# Patient Record
Sex: Female | Born: 1960
Health system: Southern US, Community
[De-identification: ages and names within clinical notes are randomized; demographics above are authoritative.]

## PROBLEM LIST (undated history)

## (undated) DIAGNOSIS — K317 Polyp of stomach and duodenum: Secondary | ICD-10-CM

## (undated) DIAGNOSIS — H33312 Horseshoe tear of retina without detachment, left eye: Secondary | ICD-10-CM

## (undated) DIAGNOSIS — E538 Deficiency of other specified B group vitamins: Secondary | ICD-10-CM

## (undated) DIAGNOSIS — T4145XA Adverse effect of unspecified anesthetic, initial encounter: Secondary | ICD-10-CM

## (undated) DIAGNOSIS — D126 Benign neoplasm of colon, unspecified: Secondary | ICD-10-CM

## (undated) DIAGNOSIS — T7840XA Allergy, unspecified, initial encounter: Secondary | ICD-10-CM

## (undated) DIAGNOSIS — K31A0 Gastric intestinal metaplasia, unspecified: Secondary | ICD-10-CM

## (undated) DIAGNOSIS — T8859XA Other complications of anesthesia, initial encounter: Secondary | ICD-10-CM

## (undated) DIAGNOSIS — K2940 Chronic atrophic gastritis without bleeding: Secondary | ICD-10-CM

## (undated) DIAGNOSIS — N63 Unspecified lump in unspecified breast: Secondary | ICD-10-CM

## (undated) DIAGNOSIS — D509 Iron deficiency anemia, unspecified: Secondary | ICD-10-CM

## (undated) DIAGNOSIS — H269 Unspecified cataract: Secondary | ICD-10-CM

## (undated) DIAGNOSIS — K219 Gastro-esophageal reflux disease without esophagitis: Secondary | ICD-10-CM

## (undated) DIAGNOSIS — E785 Hyperlipidemia, unspecified: Secondary | ICD-10-CM

## (undated) DIAGNOSIS — F419 Anxiety disorder, unspecified: Secondary | ICD-10-CM

## (undated) HISTORY — PX: BREAST CYST ASPIRATION: SHX578

## (undated) HISTORY — PX: BREAST EXCISIONAL BIOPSY: SUR124

## (undated) HISTORY — PX: COLONOSCOPY: SHX174

## (undated) HISTORY — PX: RETINAL TEAR REPAIR CRYOTHERAPY: SHX5304

## (undated) HISTORY — DX: Iron deficiency anemia, unspecified: D50.9

## (undated) HISTORY — DX: Benign neoplasm of colon, unspecified: D12.6

## (undated) HISTORY — PX: UPPER GASTROINTESTINAL ENDOSCOPY: SHX188

## (undated) HISTORY — DX: Hyperlipidemia, unspecified: E78.5

## (undated) HISTORY — PX: ABDOMINAL HYSTERECTOMY: SHX81

## (undated) HISTORY — DX: Allergy, unspecified, initial encounter: T78.40XA

## (undated) HISTORY — PX: OTHER SURGICAL HISTORY: SHX169

## (undated) HISTORY — PX: CYSTOCELE REPAIR: SHX163

## (undated) HISTORY — DX: Deficiency of other specified B group vitamins: E53.8

## (undated) HISTORY — DX: Unspecified cataract: H26.9

## (undated) HISTORY — PX: RECTOCELE REPAIR: SHX761

## (undated) HISTORY — DX: Gastro-esophageal reflux disease without esophagitis: K21.9

## (undated) HISTORY — PX: POLYPECTOMY: SHX149

## (undated) HISTORY — PX: TOE SURGERY: SHX1073

## (undated) HISTORY — DX: Gastric intestinal metaplasia, unspecified: K31.A0

## (undated) HISTORY — PX: BREAST BIOPSY: SHX20

## (undated) HISTORY — PX: WISDOM TOOTH EXTRACTION: SHX21

## (undated) HISTORY — DX: Polyp of stomach and duodenum: K31.7

## (undated) HISTORY — PX: TONSILLECTOMY: SUR1361

## (undated) HISTORY — DX: Chronic atrophic gastritis without bleeding: K29.40

---

## 1998-07-31 ENCOUNTER — Ambulatory Visit (HOSPITAL_BASED_OUTPATIENT_CLINIC_OR_DEPARTMENT_OTHER): Admission: RE | Admit: 1998-07-31 | Discharge: 1998-07-31 | Payer: Self-pay | Admitting: Orthopedic Surgery

## 1999-05-08 ENCOUNTER — Other Ambulatory Visit: Admission: RE | Admit: 1999-05-08 | Discharge: 1999-05-08 | Payer: Self-pay | Admitting: Obstetrics and Gynecology

## 2000-05-11 ENCOUNTER — Other Ambulatory Visit: Admission: RE | Admit: 2000-05-11 | Discharge: 2000-05-11 | Payer: Self-pay | Admitting: Obstetrics and Gynecology

## 2000-07-14 ENCOUNTER — Encounter (INDEPENDENT_AMBULATORY_CARE_PROVIDER_SITE_OTHER): Payer: Self-pay

## 2000-07-14 ENCOUNTER — Other Ambulatory Visit: Admission: RE | Admit: 2000-07-14 | Discharge: 2000-07-14 | Payer: Self-pay | Admitting: Obstetrics and Gynecology

## 2000-12-09 ENCOUNTER — Other Ambulatory Visit: Admission: RE | Admit: 2000-12-09 | Discharge: 2000-12-09 | Payer: Self-pay | Admitting: Obstetrics and Gynecology

## 2001-04-21 ENCOUNTER — Encounter: Admission: RE | Admit: 2001-04-21 | Discharge: 2001-04-21 | Payer: Self-pay | Admitting: Family Medicine

## 2001-04-21 ENCOUNTER — Encounter: Payer: Self-pay | Admitting: Family Medicine

## 2001-05-19 ENCOUNTER — Other Ambulatory Visit: Admission: RE | Admit: 2001-05-19 | Discharge: 2001-05-19 | Payer: Self-pay | Admitting: Obstetrics and Gynecology

## 2002-06-06 ENCOUNTER — Other Ambulatory Visit: Admission: RE | Admit: 2002-06-06 | Discharge: 2002-06-06 | Payer: Self-pay | Admitting: Obstetrics and Gynecology

## 2003-06-12 ENCOUNTER — Other Ambulatory Visit: Admission: RE | Admit: 2003-06-12 | Discharge: 2003-06-12 | Payer: Self-pay | Admitting: Obstetrics & Gynecology

## 2004-11-18 ENCOUNTER — Ambulatory Visit: Payer: Self-pay | Admitting: Psychology

## 2004-11-24 ENCOUNTER — Ambulatory Visit: Payer: Self-pay | Admitting: Psychology

## 2004-12-17 ENCOUNTER — Ambulatory Visit: Payer: Self-pay | Admitting: Psychology

## 2004-12-23 ENCOUNTER — Ambulatory Visit: Payer: Self-pay | Admitting: Psychology

## 2004-12-31 ENCOUNTER — Ambulatory Visit: Payer: Self-pay | Admitting: Psychology

## 2005-01-09 ENCOUNTER — Ambulatory Visit: Payer: Self-pay | Admitting: Psychology

## 2005-01-22 ENCOUNTER — Ambulatory Visit: Payer: Self-pay | Admitting: Psychology

## 2005-01-27 ENCOUNTER — Ambulatory Visit: Payer: Self-pay | Admitting: Psychology

## 2005-02-18 ENCOUNTER — Ambulatory Visit: Payer: Self-pay | Admitting: Psychology

## 2005-03-04 ENCOUNTER — Ambulatory Visit: Payer: Self-pay | Admitting: Psychology

## 2005-03-20 ENCOUNTER — Ambulatory Visit: Payer: Self-pay | Admitting: Psychology

## 2005-04-03 ENCOUNTER — Ambulatory Visit: Payer: Self-pay | Admitting: Psychology

## 2005-04-28 ENCOUNTER — Ambulatory Visit: Payer: Self-pay | Admitting: Psychology

## 2005-05-26 ENCOUNTER — Ambulatory Visit: Payer: Self-pay | Admitting: Psychology

## 2005-06-12 ENCOUNTER — Ambulatory Visit: Payer: Self-pay | Admitting: Psychology

## 2005-07-24 ENCOUNTER — Ambulatory Visit: Payer: Self-pay | Admitting: Psychology

## 2005-08-07 ENCOUNTER — Ambulatory Visit: Payer: Self-pay | Admitting: Psychology

## 2005-09-14 ENCOUNTER — Ambulatory Visit: Payer: Self-pay | Admitting: Psychology

## 2005-11-23 ENCOUNTER — Encounter: Admission: RE | Admit: 2005-11-23 | Discharge: 2005-11-23 | Payer: Self-pay | Admitting: Family Medicine

## 2006-01-06 ENCOUNTER — Ambulatory Visit: Payer: Self-pay | Admitting: Psychology

## 2006-10-13 ENCOUNTER — Encounter: Admission: RE | Admit: 2006-10-13 | Discharge: 2006-10-13 | Payer: Self-pay | Admitting: Obstetrics and Gynecology

## 2007-05-11 DIAGNOSIS — D126 Benign neoplasm of colon, unspecified: Secondary | ICD-10-CM

## 2007-05-11 HISTORY — DX: Benign neoplasm of colon, unspecified: D12.6

## 2007-07-29 ENCOUNTER — Encounter: Admission: RE | Admit: 2007-07-29 | Discharge: 2007-07-29 | Payer: Self-pay | Admitting: Obstetrics and Gynecology

## 2007-10-19 ENCOUNTER — Encounter: Admission: RE | Admit: 2007-10-19 | Discharge: 2007-10-19 | Payer: Self-pay | Admitting: Obstetrics and Gynecology

## 2008-04-27 ENCOUNTER — Encounter: Admission: RE | Admit: 2008-04-27 | Discharge: 2008-04-27 | Payer: Self-pay | Admitting: Obstetrics and Gynecology

## 2008-09-12 ENCOUNTER — Encounter (INDEPENDENT_AMBULATORY_CARE_PROVIDER_SITE_OTHER): Payer: Self-pay | Admitting: Orthopedic Surgery

## 2008-09-12 ENCOUNTER — Ambulatory Visit (HOSPITAL_BASED_OUTPATIENT_CLINIC_OR_DEPARTMENT_OTHER): Admission: RE | Admit: 2008-09-12 | Discharge: 2008-09-12 | Payer: Self-pay | Admitting: Orthopedic Surgery

## 2009-03-05 ENCOUNTER — Encounter: Admission: RE | Admit: 2009-03-05 | Discharge: 2009-03-05 | Payer: Self-pay | Admitting: Obstetrics and Gynecology

## 2009-10-24 ENCOUNTER — Encounter: Admission: RE | Admit: 2009-10-24 | Discharge: 2009-10-24 | Payer: Self-pay | Admitting: Obstetrics and Gynecology

## 2010-09-26 ENCOUNTER — Other Ambulatory Visit: Payer: Self-pay | Admitting: Obstetrics

## 2010-09-26 DIAGNOSIS — Z1231 Encounter for screening mammogram for malignant neoplasm of breast: Secondary | ICD-10-CM

## 2010-10-07 ENCOUNTER — Ambulatory Visit
Admission: RE | Admit: 2010-10-07 | Discharge: 2010-10-07 | Disposition: A | Discharge status: Home / Self Care | Payer: 59 | Source: Ambulatory Visit | Attending: Obstetrics | Admitting: Obstetrics

## 2010-10-07 DIAGNOSIS — Z1231 Encounter for screening mammogram for malignant neoplasm of breast: Secondary | ICD-10-CM

## 2010-10-09 ENCOUNTER — Other Ambulatory Visit: Payer: Self-pay | Admitting: Obstetrics

## 2010-10-09 DIAGNOSIS — R928 Other abnormal and inconclusive findings on diagnostic imaging of breast: Secondary | ICD-10-CM

## 2010-10-15 ENCOUNTER — Ambulatory Visit
Admission: RE | Admit: 2010-10-15 | Discharge: 2010-10-15 | Disposition: A | Discharge status: Home / Self Care | Payer: 59 | Source: Ambulatory Visit | Attending: Obstetrics | Admitting: Obstetrics

## 2010-10-15 DIAGNOSIS — R928 Other abnormal and inconclusive findings on diagnostic imaging of breast: Secondary | ICD-10-CM

## 2010-10-22 ENCOUNTER — Other Ambulatory Visit: Payer: Self-pay | Admitting: Obstetrics

## 2010-11-25 LAB — POCT HEMOGLOBIN-HEMACUE: Hemoglobin: 13.6 g/dL (ref 12.0–15.0)

## 2010-12-23 NOTE — Op Note (Signed)
NAMESHAQUANDA, Joan Cobb               ACCOUNT NO.:  [id number]   MEDICAL RECORD NO.:  [id number]          PATIENT TYPE:  AMB   LOCATION:  DSC                          FACILITY:  [facility]   PHYSICIAN:  Leonides Grills, M.D.     DATE OF BIRTH:  03-11-1961   DATE OF PROCEDURE:  09/12/2008  DATE OF DISCHARGE:                               OPERATIVE REPORT   PREOPERATIVE DIAGNOSIS:  Benign deep soft tissue lesion, left great toe.   POSTOPERATIVE DIAGNOSIS:  Benign deep soft tissue lesion, left great  toe.   OPERATION:  Excision, benign deep soft tissue lesion, left great toe.   ANESTHESIA:  General.   SURGEON:  Leonides Grills, MD   ASSISTANT:  Richardean Canal, PA-C   ESTIMATED BLOOD LOSS:  Minimal.   TOURNIQUET TIME:  Approximately 20 minutes.   COMPLICATIONS:  None.   DISPOSITION:  Stable to PR.   PATHOLOGY SPECIMEN:  Synovial cyst that was ruptured with a portion of  the EHL tendon sheath.   COMPLICATIONS:  None.   INDICATIONS:  This is a 50 year old female who has had a painful lesion  on the dorsal aspect of her left great toe over her EHL tendon.  She was  sent consented for the above procedure.  All risks of infection or  vessel injury were recurrence and approximately 50%, persistent pain,  scarring were all explained, and questions were encouraged and answered.   OPERATION:  The patient was brought to the operating room and placed in  supine position.  After adequate general anesthesia administered as well  as Ancef 1 g IV piggyback.  Left lower extremity was then prepped and  draped in sterile manner approximated with a thigh tourniquet.  Wound  was gravity exsanguinated.  Tourniquet was elevated to 290 mmHg.  A  longitudinal incision over the lesion was then made.  Dissection was  carried down through skin.  Hemostasis was obtained.  The lesion was  carefully dissected out, and the medial and lateral aspects of the EHL  tendon.  Once this was carefully dissected out, there  was no synovial  fluid in this lesion.  It was well circumscribed and once this was  removed with a portion of the tendon sheath of the EHL tendon, the area  was inspected and there was no extension into the MTP joint itself.  The  joint was ranged and there was no synovial fluid or synovial cystic type  fluid that could be expressed from this area and there was no sinus to  this area as well.  It appeared to be pristine.  The tendon itself  looked pristine as well.  There was no degeneration on the tendon  underneath the lesion and excursion of the tendon did not show anything  as well.  The area was copiously irrigated with normal saline.  Tourniquet was inflated.  Hemostasis was obtained.  Lesion was sent to  pathology.  The wound was copiously irrigated with normal saline.  The  wound was closed with 4-0 nylon stitch.  Sterile dressing was applied.  Hard sole shoe was applied.  The patient was stable to the PR.      Leonides Grills, M.D.  Electronically Signed     PB/MEDQ  D:  09/12/2008  T:  09/12/2008  Job:  [id number]

## 2011-08-10 LAB — POC HEMOCCULT BLD/STL (HOME/3-CARD/SCREEN)

## 2011-08-24 ENCOUNTER — Encounter: Payer: Self-pay | Admitting: Gastroenterology

## 2011-08-24 ENCOUNTER — Ambulatory Visit (INDEPENDENT_AMBULATORY_CARE_PROVIDER_SITE_OTHER): Payer: 59 | Admitting: Gastroenterology

## 2011-08-24 VITALS — BP 108/68 | HR 98 | Ht 65.5 in | Wt 149.2 lb

## 2011-08-24 DIAGNOSIS — Z86010 Personal history of colonic polyps: Secondary | ICD-10-CM

## 2011-08-24 DIAGNOSIS — D509 Iron deficiency anemia, unspecified: Secondary | ICD-10-CM | POA: Insufficient documentation

## 2011-08-24 DIAGNOSIS — R1319 Other dysphagia: Secondary | ICD-10-CM

## 2011-08-24 DIAGNOSIS — K5900 Constipation, unspecified: Secondary | ICD-10-CM

## 2011-08-24 MED ORDER — ALIGN 4 MG PO CAPS: 1.0000 | ORAL_CAPSULE | Freq: Every day | ORAL | Status: DC

## 2011-08-24 MED ORDER — PEG-KCL-NACL-NASULF-NA ASC-C 100 G PO SOLR: 1.0000 | Freq: Once | ORAL | Status: DC

## 2011-08-24 MED ORDER — OMEPRAZOLE 20 MG PO CPDR: 20.0000 mg | DELAYED_RELEASE_CAPSULE | Freq: Two times a day (BID) | ORAL | Status: DC

## 2011-08-24 NOTE — Patient Instructions (Addendum)
You have been scheduled for a Upper Endoscopy/Colonoscopy with propofol. See separate instructions. Pick up your prep kit from your pharmacy.  Start Align samples one tablet by mouth once daily.  Increase your omeprazole to twice daily. A new prescription has been sent to your pharmacy.  Patient advised to avoid spicy, acidic, citrus, chocolate, mints, fruit and fruit juices.  Limit the intake of caffeine, alcohol and Soda.  Don't exercise too soon after eating.  Don't lie down within 3-4 hours of eating.  Elevate the head of your bed. cc: Mosetta Putt, MD

## 2011-08-24 NOTE — Progress Notes (Addendum)
History of Present Illness: This is a 51 year old female who relates a 2 to 3 month history of postprandial abdominal bloating, early satiety with regurgitation and reflux symptoms. She also notes occasional difficulty swallowing solid foods. All these symptoms have improved, but not resolved, on bland diet and daily omeprazole. She notes constipation for the past 2-3 months which responded well to MiraLax and now she is taking a stool softener and fiber supplement with good results. She was found to have an iron deficiency anemia and Hemoccult negative stool in Dr. Geoffery Lyons office. She states she had a colonoscopy performed about 5 years ago with [facility] and was recommended to have a 5 year follow up. She does not recall polyps being diagnosed. Denies weight loss, diarrhea, change in stool caliber, melena, hematochezia, chest pain.  Allergies  Allergen Reactions  . Percocet (Oxycodone-Acetaminophen)    No outpatient prescriptions prior to visit.   Past Medical History  Diagnosis Date  . Iron deficiency anemia   . Allergic rhinitis   . Colon polyp 05/2007    adenomatous polyp   Past Surgical History  Procedure Date  . Breast biopsy     left  . Toe surgery     left  . Tonsillectomy   . Wisdom tooth extraction    History   Social History  . Marital Status: Married    Spouse Name: N/A    Number of Children: 2  . Years of Education: N/A   Occupational History  .     Social History Main Topics  . Smoking status: Never Smoker   . Smokeless tobacco: Never Used  . Alcohol Use: Yes  . Drug Use: No  . Sexually Active: None   Other Topics Concern  . None   Social History Narrative  . None   No family history on file.     Review of Systems: Pertinent positive and negative review of systems were noted in the above HPI section. All other review of systems were otherwise negative.  Physical Exam: General: Well developed , well nourished, no acute distress Head:  Normocephalic and atraumatic Eyes:  sclerae anicteric, EOMI Ears: Normal auditory acuity Mouth: No deformity or lesions Neck: Supple, no masses or thyromegaly Lungs: Clear throughout to auscultation Heart: Regular rate and rhythm; no murmurs, rubs or bruits Abdomen: Soft, non tender and non distended. No masses, hepatosplenomegaly or hernias noted. Normal Bowel sounds Rectal: Deferred to colonoscopy Musculoskeletal: Symmetrical with no gross deformities  Skin: No lesions on visible extremities Pulses:  Normal pulses noted Extremities: No clubbing, cyanosis, edema or deformities noted Neurological: Alert oriented x 4, grossly nonfocal Cervical Nodes:  No significant cervical adenopathy Inguinal Nodes: No significant inguinal adenopathy Psychological:  Alert and cooperative. Normal mood and affect  Assessment and Recommendations:  1. Iron deficiency anemia with Hemoccult negative stool. Rule out occult gastrointestinal losses from AVMs, neoplasms, ulcers, etc. Rule out celiac disease. Schedule colonoscopy and upper endoscopy with propofol as she stated she felt agitated after her prior colonoscopy. The risks, benefits, and alternatives to colonoscopy with possible biopsy and possible polypectomy were discussed with the patient and they consent to proceed. The risks, benefits, and alternatives to endoscopy with possible biopsy and possible dilation were discussed with the patient and they consent to proceed.   2. Dysphagia, GERD, regurgitation, early satiety and bloating. Rule out GERD, esophagitis, esophageal strictures. Upper endoscopy as above. Standard antireflux measures. Increase omeprazole to 20 mg twice a day. Trial of a low gas diet and  a daily probiotic.  3. New onset constipation. Continue high fiber diet with increased water intake and daily stool softener. Colonoscopy as above.  4. Adenomatous colon polyp, 05/2007. Colonoscopy as above.

## 2011-09-08 ENCOUNTER — Encounter: Payer: Self-pay | Admitting: Gastroenterology

## 2011-09-11 ENCOUNTER — Encounter: Payer: Self-pay | Admitting: Gastroenterology

## 2011-09-11 ENCOUNTER — Ambulatory Visit (AMBULATORY_SURGERY_CENTER): Payer: 59 | Admitting: Gastroenterology

## 2011-09-11 DIAGNOSIS — K5900 Constipation, unspecified: Secondary | ICD-10-CM

## 2011-09-11 DIAGNOSIS — R1319 Other dysphagia: Secondary | ICD-10-CM

## 2011-09-11 DIAGNOSIS — D126 Benign neoplasm of colon, unspecified: Secondary | ICD-10-CM

## 2011-09-11 DIAGNOSIS — K319 Disease of stomach and duodenum, unspecified: Secondary | ICD-10-CM

## 2011-09-11 DIAGNOSIS — D509 Iron deficiency anemia, unspecified: Secondary | ICD-10-CM

## 2011-09-11 DIAGNOSIS — Z86010 Personal history of colonic polyps: Secondary | ICD-10-CM

## 2011-09-11 MED ORDER — SODIUM CHLORIDE 0.9 % IV SOLN: 500.0000 mL | INTRAVENOUS | Status: DC

## 2011-09-11 NOTE — Op Note (Addendum)
[facility] [address] [place], [place]  [address]  ENDOSCOPY PROCEDURE REPORT PATIENT:  Joan, Cobb  MR#:  [id number] BIRTHDATE:  06/03/1961, 50 yrs. old  GENDER:  female ENDOSCOPIST:  Judie Petit T. Russella Dar, MD, [facility] Referred by:  Mosetta Putt, M.D. PROCEDURE DATE:  09/11/2011 PROCEDURE:  EGD with biopsy and with Savary dilation over a guidewire ASA CLASS:  Class II INDICATIONS:  GERD, dysphagia, iron deficiency anemia MEDICATIONS:  MAC sedation administered by CRNA,  residual sedation effect present from prior procedure, propofol (Diprivan) 350mg  IV TOPICAL ANESTHETIC:  none DESCRIPTION OF PROCEDURE:   After the risks benefits and alternatives of the procedure were thoroughly explained, informed consent was obtained.  The [facility] GIF-H180 [id number] endoscope was introduced through the mouth and advanced to the second portion of the duodenum, without limitations.  The instrument was slowly withdrawn as the mucosa was fully examined. <<PROCEDUREIMAGES>> There were 5-6 polyps identified in the body of the stomach. Measuring 3-4 mm. Multiple biopsies were obtained and sent to pathology.  Mild gastritis was found in the body and fundus of the stomach. It was erythematous and granular. Multiple biopsies were obtained and sent to pathology.  Otherwise normal stomach.  The esophagus and gastroesophageal junction were completely normal in appearance. Savary / guidewire with a 17mm with no heme and no resistance. The duodenal bulb was normal in appearance, as was the postbulbar duodenum. Random biopsies were obtained and sent to pathology.  Retroflexed views revealed a hiatal hernia, small. The scope was then withdrawn from the patient and the procedure completed.  COMPLICATIONS:  None  ENDOSCOPIC IMPRESSION: 1) Gastric polyps 2) Mild gastritis 3) Small hiatal hernia  RECOMMENDATIONS: 1) Anti-reflux regimen 2) Await pathology results 3) Continue PPI: omeprazole 20 mg po  bid 4) OP follow-up in 4 weeks 5) Post dilation instructions  Joan Alejo T. Russella Dar, MD, [name]  n. eSIGNED:   Venita Lick. Nadine Ryle at 09/11/2011 12:02 PM  Clabe Seal, [phone]

## 2011-09-11 NOTE — Patient Instructions (Signed)
FOLLOW DISCHARGE INSTRUCTIONS (BLUE & GREEN SHEETS).  FOLLOW DILATATION DIET GIVEN TO YOU TODAY  INFORMATION ON GASTRITIS GIVEN TO YOU  CONTINUE REFLUX MEDICATION TWICE DAILY ( OMEPRAZOLE)

## 2011-09-11 NOTE — Progress Notes (Signed)
Patient did not experience a hospital transfer or hospital admission upon discharge from [facility]. [phone]) Patient did not experience any of the following events: a burn prior to discharge; a fall within the facility; wrong site/side/patient/procedure/implant event; or a hospital transfer or hospital admission upon discharge from the facility. [phone])

## 2011-09-11 NOTE — Op Note (Signed)
[facility] [address] [place], [place]  [address]  COLONOSCOPY PROCEDURE REPORT  PATIENT:  Joan, Cobb  MR#:  [id number] BIRTHDATE:  [birth year], 50 yrs. old  GENDER:  female ENDOSCOPIST:  Judie Petit T. Russella Dar, MD, [facility] Referred by:  Mosetta Putt, M.D. PROCEDURE DATE:  09/11/2011 PROCEDURE:  Colonoscopy with biopsy ASA CLASS:  Class II INDICATIONS:  1) Iron deficiency anemia  2) history of pre-cancerous (adenomatous) colon polyps: 05/2007. MEDICATIONS:   MAC sedation, administered by CRNA, propofol (Diprivan) 350 mg IV DESCRIPTION OF PROCEDURE:   After the risks benefits and alternatives of the procedure were thoroughly explained, informed consent was obtained.  Digital rectal exam was performed and revealed no abnormalities.   The LB 180AL [id number] endoscope was introduced through the anus and advanced to the cecum, which was identified by both the appendix and ileocecal valve, without limitations.  The quality of the prep was excellent, using MoviPrep.  The instrument was then slowly withdrawn as the colon was fully examined. <<PROCEDUREIMAGES>> FINDINGS:  A sessile polyp was found in the sigmoid colon. It was 4 mm in size. The polyp was removed using cold biopsy forceps. Otherwise normal colonoscopy without other polyps, masses, vascular ectasias, or inflammatory changes. Retroflexed views in the rectum revealed no abnormalities.  The time to cecum =  2.25 minutes. The scope was then withdrawn (time =  10.5  min) from the patient and the procedure completed.  COMPLICATIONS:  None  ENDOSCOPIC IMPRESSION: 1) 4 mm sessile polyp in the sigmoid colon  RECOMMENDATIONS: 1) Await pathology results 2) Repeat Colonoscopy in 5 years.  Venita Lick. Russella Dar, MD, [name]  n. eSIGNED:   Venita Lick. Stark at 09/11/2011 11:41 AM  Clabe Seal, [phone]

## 2011-09-14 ENCOUNTER — Telehealth: Payer: Self-pay | Admitting: *Deleted

## 2011-09-14 NOTE — Telephone Encounter (Signed)
No answer. Left message to call if questions or concerns. 

## 2011-09-24 ENCOUNTER — Encounter: Payer: Self-pay | Admitting: Gastroenterology

## 2011-10-14 ENCOUNTER — Ambulatory Visit: Payer: 59 | Admitting: Gastroenterology

## 2011-10-26 ENCOUNTER — Encounter: Payer: Self-pay | Admitting: Gastroenterology

## 2011-10-26 ENCOUNTER — Ambulatory Visit (INDEPENDENT_AMBULATORY_CARE_PROVIDER_SITE_OTHER): Payer: 59 | Admitting: Gastroenterology

## 2011-10-26 ENCOUNTER — Other Ambulatory Visit: Payer: 59

## 2011-10-26 VITALS — BP 120/68 | HR 72 | Ht 65.0 in | Wt 148.0 lb

## 2011-10-26 DIAGNOSIS — K219 Gastro-esophageal reflux disease without esophagitis: Secondary | ICD-10-CM

## 2011-10-26 DIAGNOSIS — K2940 Chronic atrophic gastritis without bleeding: Secondary | ICD-10-CM

## 2011-10-26 NOTE — Progress Notes (Signed)
History of Present Illness: This is a 51 year old female returning for followup of iron deficiency anemia, GERD and atrophic gastritis. Her reflux symptoms are well controlled on antireflux measures and omeprazole. She has alternating frequent bowel movements and mild constipation. Symptoms have improved somewhat on Colace and high-fiber diet. Her gastric biopsy report is below:  CHRONIC ACTIVE ATROPHIC GASTRITIS WITH ASSOCIATED FOVEOLAR HYPERPLASIA, EXTENSIVE INTESTINAL METAPLASIA AND NEUROENDOCRINE CELL PROLIFERATION. - NO EVIDENCE OF HELICOBACTER PYLORI, DYSPLASIA OR MALIGNANCY.  Current Medications, Allergies, Past Medical History, Past Surgical History, Family History and Social History were reviewed in [employer] record.  Physical Exam: General: Well developed , well nourished, no acute distress Psychological:  Alert and cooperative. Normal mood and affect no additional physical exam performed today  Assessment and Recommendations:  1. Atrophic gastritis with extensive intestinal metaplasia and neuroendocrine cell proliferation. The features on her biopsy suggests that her atrophic gastritis is trending toward its a precancerous process and it should be monitored. I previously reviewed the biopsy results with another pathologist and reviewed potential management options with 2 of my GI colleagues. I spent 25 minutes with her discussing her condition and the recommended followup. Recommend repeat upper endoscopy with gastric mapping in one year to include multiple, separate biopsies of the antrum, body and fundus. Obtain an anti-parietal antibody and anti-intrinsic factor antibody today. Consider future endoscopies every 1 to 2 years based on findings at endoscopy.  2. GERD. Symptoms well controlled on antireflux measures and omeprazole twice a day. Reduce omeprazole to once daily and if her symptoms remain controlled she may taper off omeprazole and retreat as  needed.  3. Personal history of adenomatous colon polyps. Surveillance colonoscopy February 2018.  4. Alternating bowel habits. Continue high fiber diet with adequate daily water intake. He tried several different products and taper to discontinue Colace she may use Colace as needed.  5. Iron deficiency anemia. Possibly secondary to reduced gastric acid which is associated with atrophic gastritis however there was no other gastrointestinal explanation for iron deficiency uncovered. Followup with Dr. Cora Daniels.

## 2011-10-26 NOTE — Patient Instructions (Addendum)
Your physician has requested that you go to the basement for the following lab work before leaving today:Parietal Cell Antibody, Intrinsic Factor Antibodies. Decrease taking your omeprazole to once daily and continue to decrease if symptoms under control to every other day and then every 3rd day. After wards stop if symptoms have not returned.  You will be due for a recall Endoscopy in 09/10/2012. We will send you a reminder in the mail when it gets closer to that time.  cc: Mosetta Putt, MD

## 2011-10-27 LAB — INTRINSIC FACTOR ANTIBODIES: Intrinsic Factor: POSITIVE — AB

## 2011-10-28 LAB — ANTI-PARIETAL ANTIBODY: Parietal Cell Antibody-IgG: POSITIVE — AB

## 2011-10-29 ENCOUNTER — Other Ambulatory Visit: Payer: Self-pay

## 2011-10-29 DIAGNOSIS — D510 Vitamin B12 deficiency anemia due to intrinsic factor deficiency: Secondary | ICD-10-CM

## 2011-10-30 ENCOUNTER — Other Ambulatory Visit (INDEPENDENT_AMBULATORY_CARE_PROVIDER_SITE_OTHER): Payer: 59

## 2011-10-30 DIAGNOSIS — D510 Vitamin B12 deficiency anemia due to intrinsic factor deficiency: Secondary | ICD-10-CM

## 2011-10-30 LAB — CBC WITH DIFFERENTIAL/PLATELET
Basophils Absolute: 0.1 10*3/uL (ref 0.0–0.1)
Basophils Relative: 0.9 % (ref 0.0–3.0)
Eosinophils Relative: 2.3 % (ref 0.0–5.0)
HCT: 38.8 % (ref 36.0–46.0)
Hemoglobin: 12.9 g/dL (ref 12.0–15.0)
Lymphocytes Relative: 23.4 % (ref 12.0–46.0)
Monocytes Relative: 5.4 % (ref 3.0–12.0)
Neutro Abs: 4.5 10*3/uL (ref 1.4–7.7)
RBC: 4.46 Mil/uL (ref 3.87–5.11)
WBC: 6.6 10*3/uL (ref 4.5–10.5)

## 2011-11-02 LAB — VITAMIN B12: Vitamin B-12: 138 pg/mL — ABNORMAL LOW (ref 211–911)

## 2011-11-13 ENCOUNTER — Other Ambulatory Visit: Payer: Self-pay | Admitting: Obstetrics

## 2011-11-13 DIAGNOSIS — Z1231 Encounter for screening mammogram for malignant neoplasm of breast: Secondary | ICD-10-CM

## 2011-11-23 IMAGING — MG MM DIGITAL DIAGNOSTIC BILAT LTD {BCG}
4 series · 4 of 4 positions shown · non-contrast
Comparison: 03/05/2009, 07/29/2007

CLINICAL DATA: The patient returns for evaluation of  fluctuating
masses in each breast noted on screening study dated 10/07/2010.

DIGITAL DIAGNOSTIC BILATERAL LIMITED MAMMOGRAM  AND BILATERAL
BREAST ULTRASOUND:

[L CC]
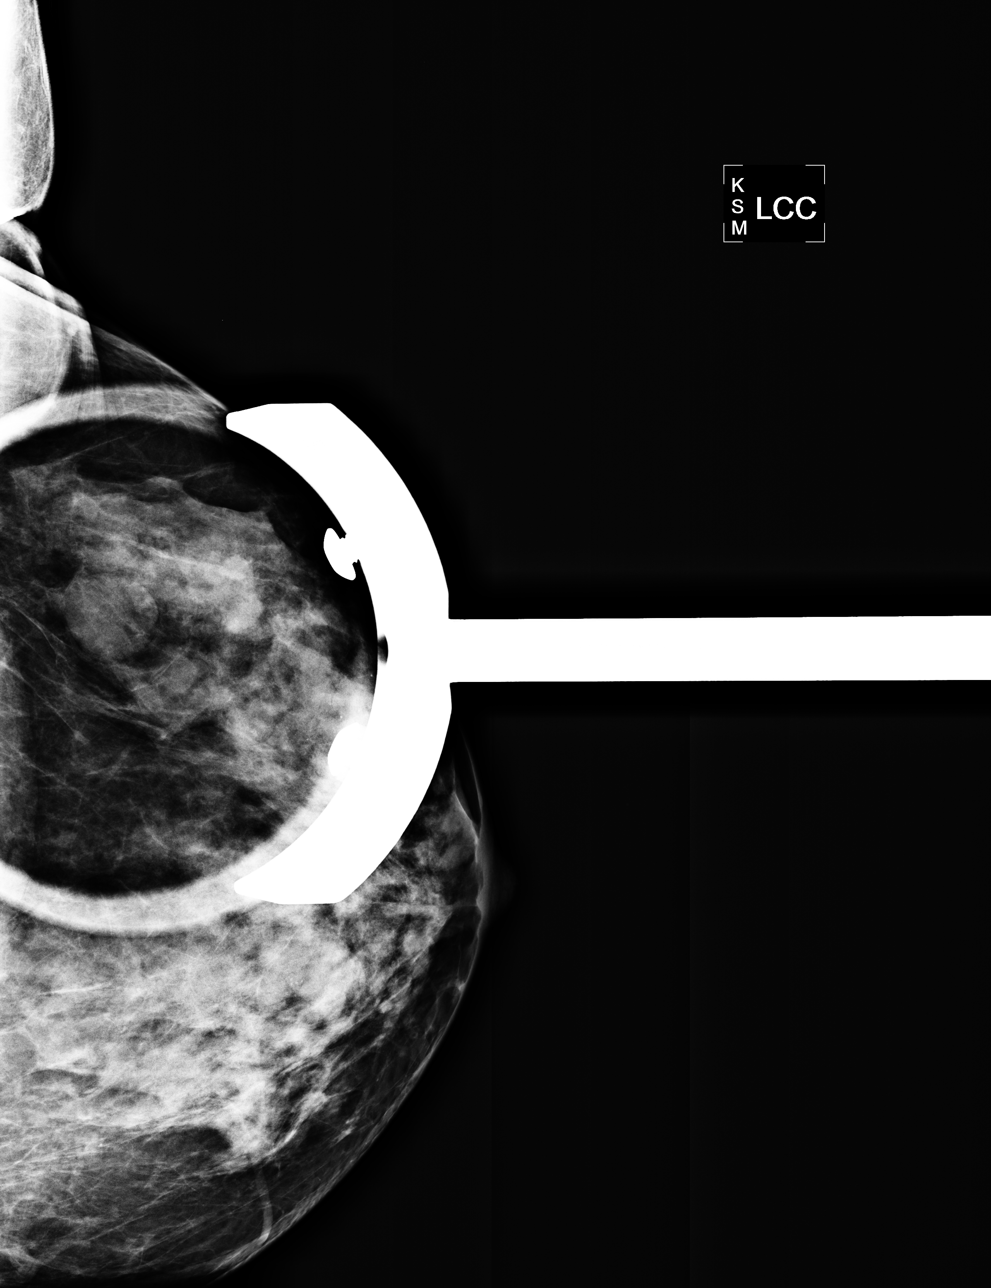

[L MLO]
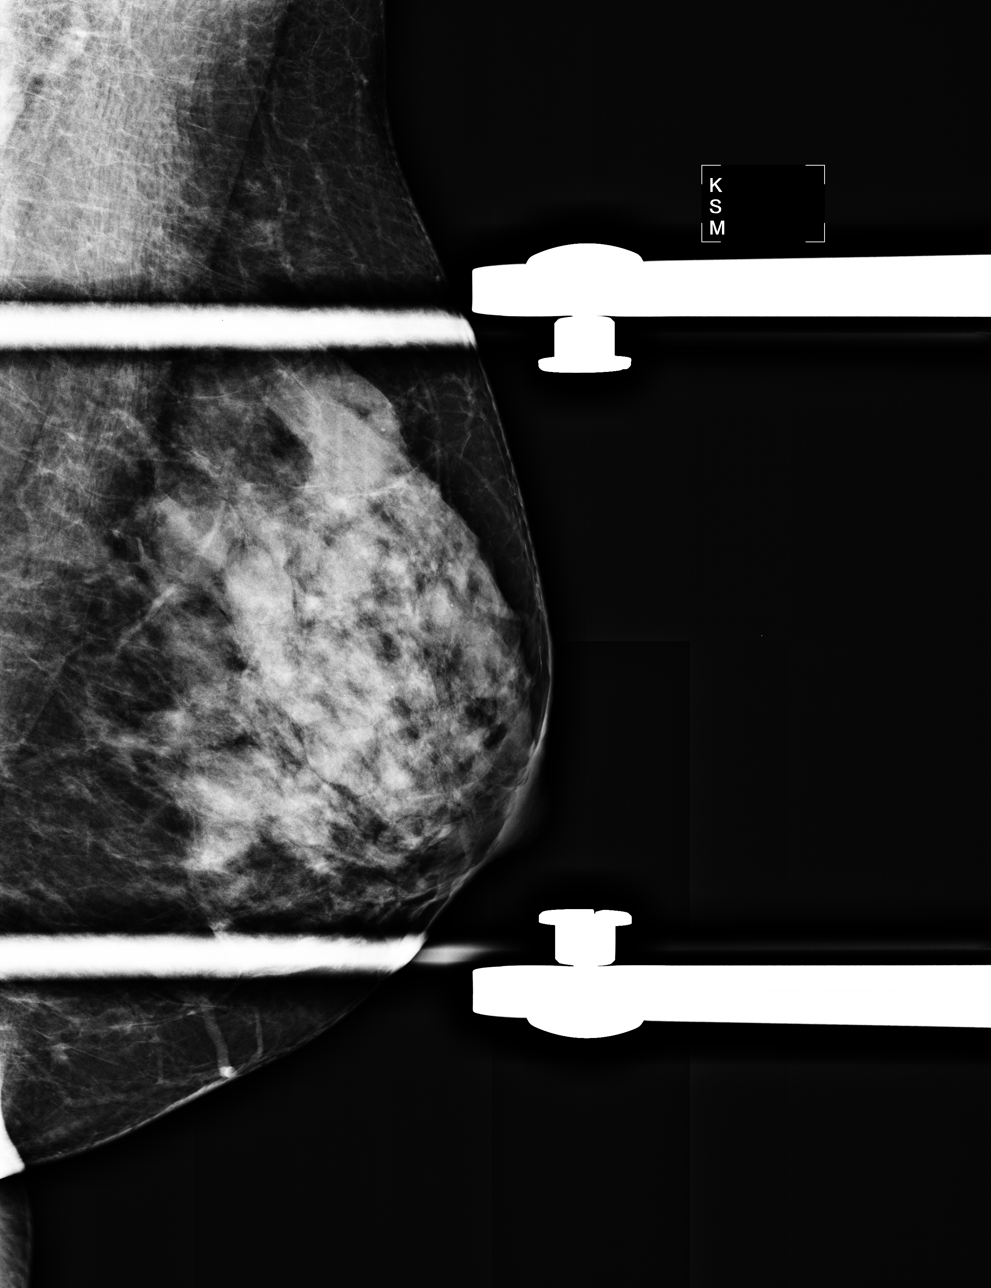

[R CC]
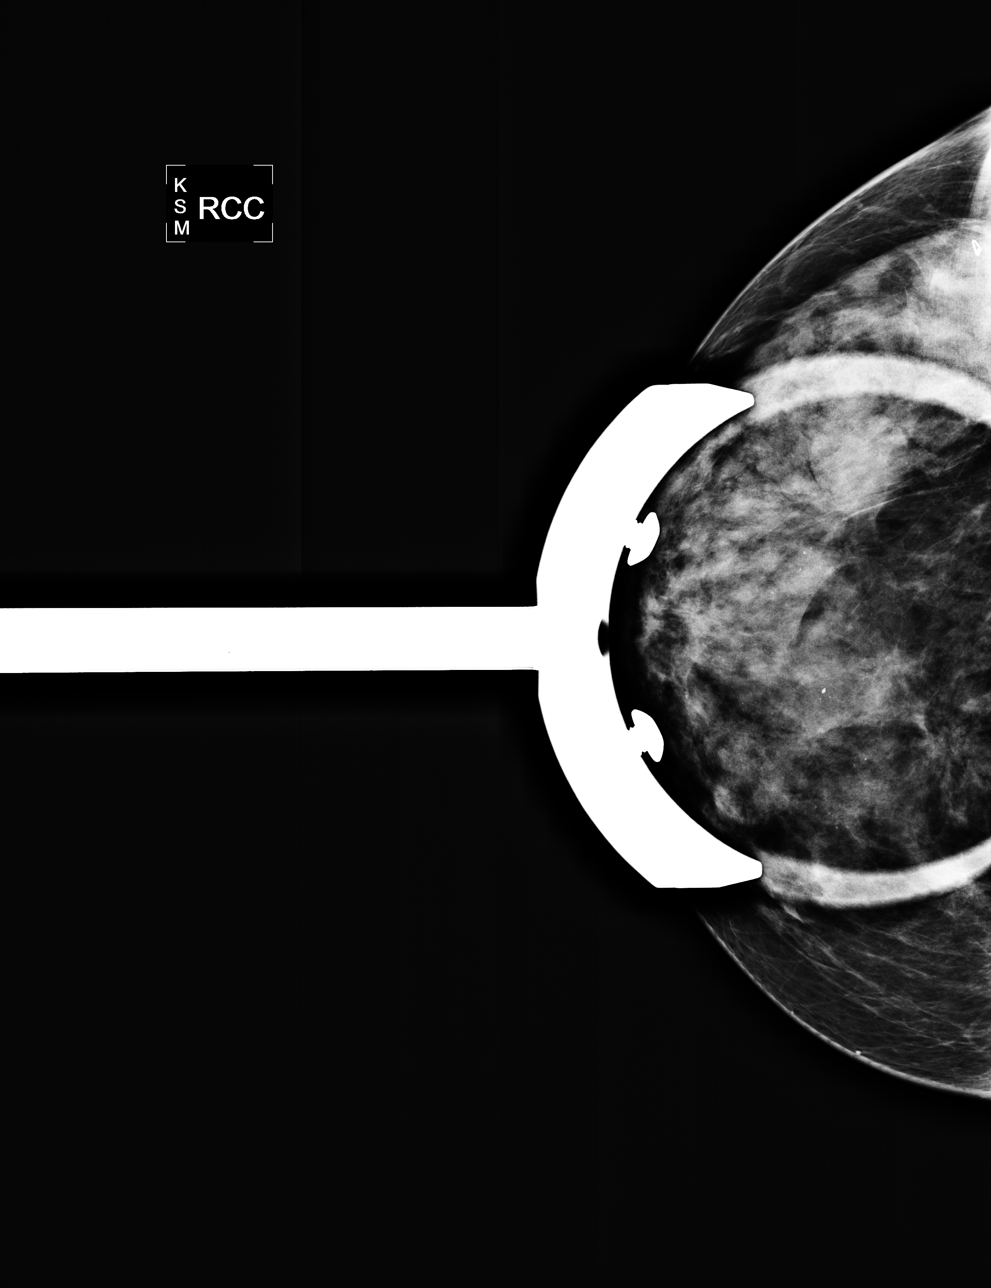

[R MLO]
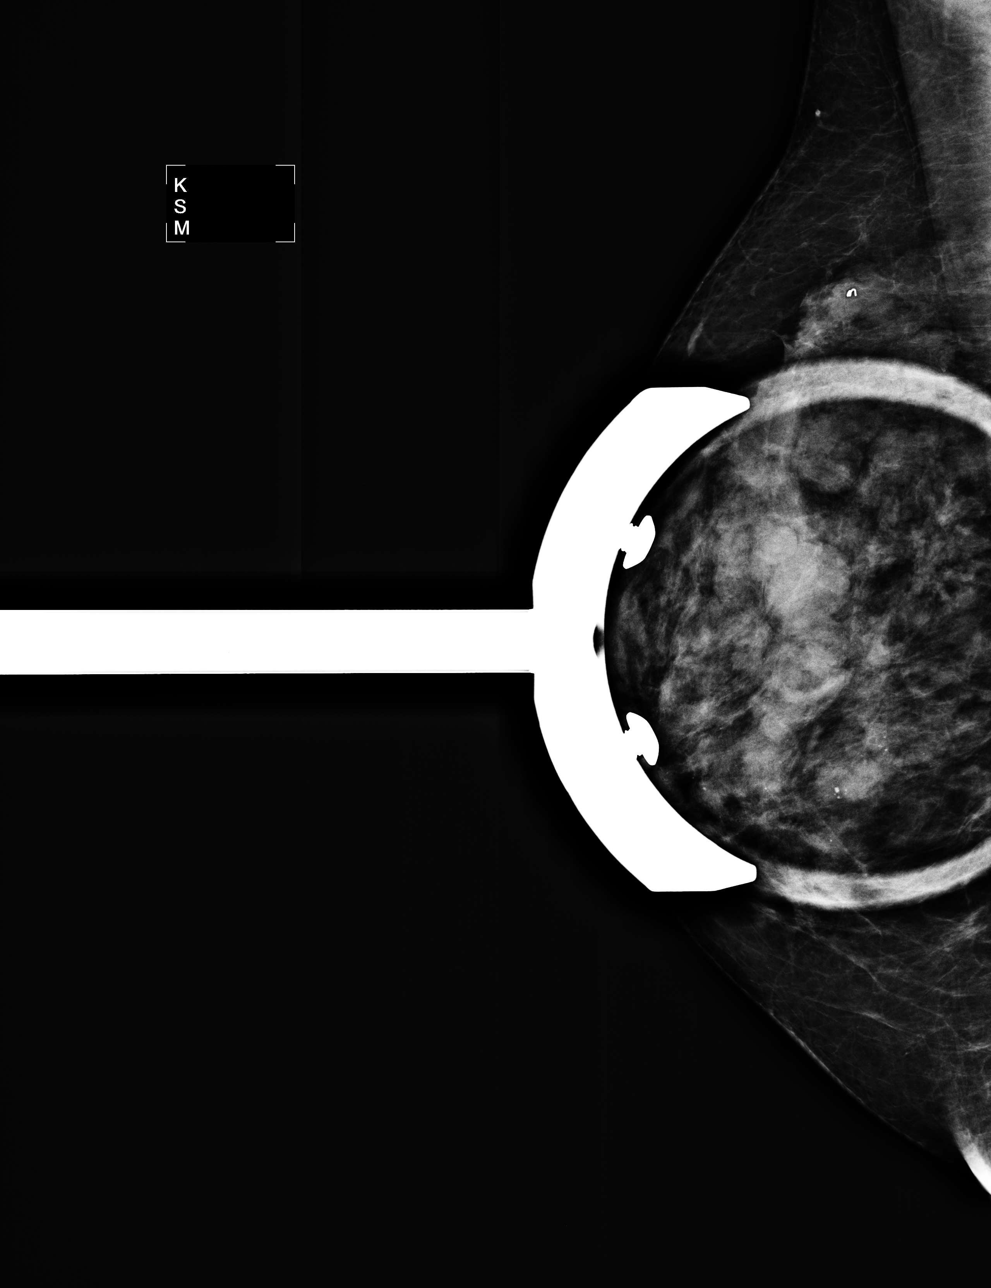

[4 of 4 positions shown; findings below may reference images not displayed]

FINDINGS: Additional views confirm the presence of multiple
bilateral partially obscured partially circumscribed round masses.
Mammographic images were processed with CAD.

On physical exam, there is diffuse lumpiness throughout each
breast.

Ultrasound is performed, showing multiple cysts scattered
throughout both breasts with no solid mass, distortion or shadowing
to suggest malignancy.
IMPRESSION: No mammographic or sonographic evidence of malignancy.  Multiple
bilateral breast cysts.  Yearly screening mammography is suggested.

BI-RADS CATEGORY 2:  Benign finding(s).

## 2011-12-31 ENCOUNTER — Other Ambulatory Visit: Payer: Self-pay | Admitting: Obstetrics

## 2011-12-31 ENCOUNTER — Ambulatory Visit
Admission: RE | Admit: 2011-12-31 | Discharge: 2011-12-31 | Disposition: A | Discharge status: Home / Self Care | Payer: 59 | Source: Ambulatory Visit | Attending: Obstetrics | Admitting: Obstetrics

## 2011-12-31 DIAGNOSIS — Z1231 Encounter for screening mammogram for malignant neoplasm of breast: Secondary | ICD-10-CM

## 2011-12-31 DIAGNOSIS — R928 Other abnormal and inconclusive findings on diagnostic imaging of breast: Secondary | ICD-10-CM

## 2012-01-19 ENCOUNTER — Ambulatory Visit
Admission: RE | Admit: 2012-01-19 | Discharge: 2012-01-19 | Disposition: A | Discharge status: Home / Self Care | Payer: 59 | Source: Ambulatory Visit | Attending: Obstetrics | Admitting: Obstetrics

## 2012-01-19 DIAGNOSIS — N6310 Unspecified lump in the right breast, unspecified quadrant: Secondary | ICD-10-CM

## 2012-01-19 DIAGNOSIS — R928 Other abnormal and inconclusive findings on diagnostic imaging of breast: Secondary | ICD-10-CM

## 2012-09-09 ENCOUNTER — Encounter: Payer: Self-pay | Admitting: Gastroenterology

## 2012-09-27 ENCOUNTER — Ambulatory Visit (AMBULATORY_SURGERY_CENTER): Payer: 59

## 2012-09-27 ENCOUNTER — Encounter: Payer: Self-pay | Admitting: Gastroenterology

## 2012-09-27 VITALS — Ht 65.5 in | Wt 149.8 lb

## 2012-09-27 DIAGNOSIS — K2970 Gastritis, unspecified, without bleeding: Secondary | ICD-10-CM

## 2012-09-27 DIAGNOSIS — K219 Gastro-esophageal reflux disease without esophagitis: Secondary | ICD-10-CM

## 2012-09-27 DIAGNOSIS — K2990 Gastroduodenitis, unspecified, without bleeding: Secondary | ICD-10-CM

## 2012-10-14 ENCOUNTER — Encounter: Payer: 59 | Admitting: Gastroenterology

## 2012-10-21 ENCOUNTER — Encounter: Payer: Self-pay | Admitting: Gastroenterology

## 2012-10-21 ENCOUNTER — Encounter: Payer: 59 | Admitting: Gastroenterology

## 2012-10-21 ENCOUNTER — Ambulatory Visit (AMBULATORY_SURGERY_CENTER): Payer: 59 | Admitting: Gastroenterology

## 2012-10-21 VITALS — BP 129/91 | HR 71 | Temp 98.6°F | Resp 14 | Ht 65.5 in | Wt 149.0 lb

## 2012-10-21 DIAGNOSIS — K2940 Chronic atrophic gastritis without bleeding: Secondary | ICD-10-CM

## 2012-10-21 DIAGNOSIS — K219 Gastro-esophageal reflux disease without esophagitis: Secondary | ICD-10-CM

## 2012-10-21 DIAGNOSIS — K2990 Gastroduodenitis, unspecified, without bleeding: Secondary | ICD-10-CM

## 2012-10-21 DIAGNOSIS — K2970 Gastritis, unspecified, without bleeding: Secondary | ICD-10-CM

## 2012-10-21 MED ORDER — SODIUM CHLORIDE 0.9 % IV SOLN: 500.0000 mL | INTRAVENOUS | Status: DC

## 2012-10-21 NOTE — Op Note (Signed)
[facility] [address] [place], [address]   ENDOSCOPY PROCEDURE REPORT  PATIENT: Joan Cobb, Joan Cobb  MR#: [id number] BIRTHDATE: 1961-02-20 , 51  yrs. old GENDER: Female ENDOSCOPIST: Meryl Dare, MD, [facility] PROCEDURE DATE:  10/21/2012 PROCEDURE:  EGD w/ biopsy ASA CLASS:     Class II INDICATIONS:  follow up atrophic gastritis with intestinal metaplasia. MEDICATIONS: MAC sedation, administered by CRNA and propofol (Diprivan) 200mg  IV TOPICAL ANESTHETIC: none DESCRIPTION OF PROCEDURE: After the risks benefits and alternatives of the procedure were thoroughly explained, informed consent was obtained.  The [facility] GIF-H180 [id number] endoscope was introduced through the mouth and advanced to the second portion of the duodenum without limitations.  The instrument was slowly withdrawn as the mucosa was fully examined.  STOMACH: Atrophic abnormal mucosa was found in the gastric body and gastric fundus.  The mucosa was erythematous, granular and atrophic.  The gastric folds were diminished. A few scattered small polyps were noted. Multiple biopsies were performed with fundus and body biopsies in separate specimen containers.  The antrum of the stomach appeared normal.  Multiple biopsies were performed in the 3rd specimen container. ESOPHAGUS: The mucosa of the esophagus appeared normal. DUODENUM: The duodenal mucosa showed no abnormalities.  Retroflexed views revealed a hiatal hernia.     The scope was then withdrawn from the patient and the procedure completed.  COMPLICATIONS: There were no complications.  ENDOSCOPIC IMPRESSION: 1.   Atrophic gastritis; multiple biopsies 2.   Small hiatal hernia  RECOMMENDATIONS: 1.  Await pathology results 2.  Endoscopy in 1-2 years pending pathology review   eSigned:  Meryl Dare, MD, [facility] 10/21/2012 11:50 AM   XB:JYNWG Duaine Dredge, MD

## 2012-10-21 NOTE — Progress Notes (Signed)
Called to room to assist during endoscopic procedure.  Patient ID and intended procedure confirmed with present staff. Received instructions for my participation in the procedure from the performing physician. ewm 

## 2012-10-21 NOTE — Patient Instructions (Addendum)
YOU HAD AN ENDOSCOPIC PROCEDURE TODAY AT [facility]: Refer to the procedure report that was given to you for any specific questions about what was found during the examination.  If the procedure report does not answer your questions, please call your gastroenterologist to clarify.  If you requested that your care partner not be given the details of your procedure findings, then the procedure report has been included in a sealed envelope for you to review at your convenience later.  YOU SHOULD EXPECT: Some feelings of bloating in the abdomen. Passage of more gas than usual.  Walking can help get rid of the air that was put into your GI tract during the procedure and reduce the bloating. If you had a lower endoscopy (such as a colonoscopy or flexible sigmoidoscopy) you may notice spotting of blood in your stool or on the toilet paper. If you underwent a bowel prep for your procedure, then you may not have a normal bowel movement for a few days.  DIET: Your first meal following the procedure should be a light meal and then it is ok to progress to your normal diet.  A half-sandwich or bowl of soup is an example of a good first meal.  Heavy or fried foods are harder to digest and may make you feel nauseous or bloated.  Likewise meals heavy in dairy and vegetables can cause extra gas to form and this can also increase the bloating.  Drink plenty of fluids but you should avoid alcoholic beverages for 24 hours.  ACTIVITY: Your care partner should take you home directly after the procedure.  You should plan to take it easy, moving slowly for the rest of the day.  You can resume normal activity the day after the procedure however you should NOT DRIVE or use heavy machinery for 24 hours (because of the sedation medicines used during the test).    SYMPTOMS TO REPORT IMMEDIATELY: A gastroenterologist can be reached at any hour.  During normal business hours, 8:30 AM to 5:00 PM Monday through Friday,  call [phone].  After hours and on weekends, please call the GI answering service at [phone] emergency number  who will take a message and have the physician on call contact you.   Following upper endoscopy (EGD)  Vomiting of blood or coffee ground material  New chest pain or pain under the shoulder blades  Painful or persistently difficult swallowing  New shortness of breath  Fever of 100F or higher  Black, tarry-looking stools  FOLLOW UP: If any biopsies were taken you will be contacted by phone or by letter within the next 1-3 weeks.  Call your gastroenterologist if you have not heard about the biopsies in 3 weeks.  Our staff will call the home number listed on your records the next business day following your procedure to check on you and address any questions or concerns that you may have at that time regarding the information given to you following your procedure. This is a courtesy call and so if there is no answer at the home number and we have not heard from you through the emergency physician on call, we will assume that you have returned to your regular daily activities without incident.  SIGNATURES/CONFIDENTIALITY: You and/or your care partner have signed paperwork which will be entered into your electronic medical record.  These signatures attest to the fact that that the information above on your After Visit Summary has been reviewed and is understood.  Full responsibility of the confidentiality of this discharge information lies with you and/or your care-partner.  Handouts on gastritis, hiatal hernia Endoscopy in 1-2 years pending pathology results

## 2012-10-21 NOTE — Progress Notes (Signed)
Patient did not experience any of the following events: a burn prior to discharge; a fall within the facility; wrong site/side/patient/procedure/implant event; or a hospital transfer or hospital admission upon discharge from the facility. (G8907) Patient did not have preoperative order for IV antibiotic SSI prophylaxis. (G8918)  

## 2012-10-21 NOTE — Progress Notes (Signed)
Report to pacu rn, vss, bbs=clear 

## 2012-10-24 ENCOUNTER — Telehealth: Payer: Self-pay | Admitting: *Deleted

## 2012-10-24 ENCOUNTER — Other Ambulatory Visit: Payer: Self-pay

## 2012-10-24 MED ORDER — OMEPRAZOLE 20 MG PO CPDR: 20.0000 mg | DELAYED_RELEASE_CAPSULE | Freq: Every day | ORAL | Status: DC

## 2012-10-24 NOTE — Telephone Encounter (Signed)
Number identifier, left message, follow-up  

## 2012-11-01 ENCOUNTER — Encounter: Payer: Self-pay | Admitting: Gastroenterology

## 2012-11-08 ENCOUNTER — Ambulatory Visit
Admission: RE | Admit: 2012-11-08 | Discharge: 2012-11-08 | Disposition: A | Discharge status: Home / Self Care | Payer: 59 | Source: Ambulatory Visit | Attending: Family Medicine | Admitting: Family Medicine

## 2012-11-08 ENCOUNTER — Other Ambulatory Visit: Payer: Self-pay | Admitting: Family Medicine

## 2012-11-08 DIAGNOSIS — R609 Edema, unspecified: Secondary | ICD-10-CM

## 2012-11-08 DIAGNOSIS — R52 Pain, unspecified: Secondary | ICD-10-CM

## 2013-01-16 ENCOUNTER — Other Ambulatory Visit: Payer: Self-pay | Admitting: Obstetrics

## 2013-01-24 ENCOUNTER — Encounter (HOSPITAL_COMMUNITY): Payer: Self-pay

## 2013-01-26 ENCOUNTER — Ambulatory Visit (HOSPITAL_COMMUNITY): Payer: 59 | Admitting: Anesthesiology

## 2013-01-26 ENCOUNTER — Encounter (HOSPITAL_COMMUNITY)
Admission: RE | Disposition: A | Discharge status: Home / Self Care | Payer: Self-pay | Source: Ambulatory Visit | Attending: Obstetrics

## 2013-01-26 ENCOUNTER — Ambulatory Visit (HOSPITAL_COMMUNITY)
Admission: RE | Admit: 2013-01-26 | Discharge: 2013-01-26 | Disposition: A | Discharge status: Home / Self Care | Payer: 59 | Source: Ambulatory Visit | Attending: Obstetrics | Admitting: Obstetrics

## 2013-01-26 ENCOUNTER — Encounter (HOSPITAL_COMMUNITY): Payer: Self-pay | Admitting: Anesthesiology

## 2013-01-26 ENCOUNTER — Encounter (HOSPITAL_COMMUNITY): Payer: Self-pay | Admitting: *Deleted

## 2013-01-26 DIAGNOSIS — N840 Polyp of corpus uteri: Secondary | ICD-10-CM | POA: Insufficient documentation

## 2013-01-26 DIAGNOSIS — D649 Anemia, unspecified: Secondary | ICD-10-CM | POA: Insufficient documentation

## 2013-01-26 DIAGNOSIS — N393 Stress incontinence (female) (male): Secondary | ICD-10-CM | POA: Insufficient documentation

## 2013-01-26 DIAGNOSIS — D250 Submucous leiomyoma of uterus: Secondary | ICD-10-CM | POA: Insufficient documentation

## 2013-01-26 DIAGNOSIS — N3020 Other chronic cystitis without hematuria: Secondary | ICD-10-CM | POA: Insufficient documentation

## 2013-01-26 DIAGNOSIS — K2960 Other gastritis without bleeding: Secondary | ICD-10-CM | POA: Insufficient documentation

## 2013-01-26 DIAGNOSIS — N6009 Solitary cyst of unspecified breast: Secondary | ICD-10-CM | POA: Insufficient documentation

## 2013-01-26 DIAGNOSIS — N920 Excessive and frequent menstruation with regular cycle: Secondary | ICD-10-CM | POA: Insufficient documentation

## 2013-01-26 DIAGNOSIS — N946 Dysmenorrhea, unspecified: Secondary | ICD-10-CM | POA: Insufficient documentation

## 2013-01-26 DIAGNOSIS — Z79899 Other long term (current) drug therapy: Secondary | ICD-10-CM | POA: Insufficient documentation

## 2013-01-26 LAB — CBC
MCV: 80.9 fL (ref 78.0–100.0)
Platelets: 278 10*3/uL (ref 150–400)
RBC: 4.5 MIL/uL (ref 3.87–5.11)
WBC: 9.7 10*3/uL (ref 4.0–10.5)

## 2013-01-26 LAB — BASIC METABOLIC PANEL
CO2: 25 mEq/L (ref 19–32)
Calcium: 9 mg/dL (ref 8.4–10.5)
GFR calc Af Amer: >90 mL/min (ref >90)
Sodium: 139 mEq/L (ref 135–145)

## 2013-01-26 LAB — TYPE AND SCREEN
ABO/RH(D): B POS
Antibody Screen: NEGATIVE

## 2013-01-26 LAB — PREGNANCY, URINE: Preg Test, Ur: NEGATIVE

## 2013-01-26 SURGERY — DILATATION & CURETTAGE/HYSTEROSCOPY WITH TRUCLEAR
Anesthesia: General | Site: Vagina | Wound class: Clean Contaminated

## 2013-01-26 MED ORDER — ONDANSETRON HCL 4 MG/2ML IJ SOLN
INTRAMUSCULAR | Status: DC | PRN
  Administered 2013-01-26: 4 mg via INTRAVENOUS

## 2013-01-26 MED ORDER — DEXAMETHASONE SODIUM PHOSPHATE 10 MG/ML IJ SOLN
INTRAMUSCULAR | Status: AC
  Filled 2013-01-26: qty 1

## 2013-01-26 MED ORDER — LIDOCAINE HCL (CARDIAC) 20 MG/ML IV SOLN
INTRAVENOUS | Status: AC
  Filled 2013-01-26: qty 5

## 2013-01-26 MED ORDER — MIDAZOLAM HCL 2 MG/2ML IJ SOLN
INTRAMUSCULAR | Status: AC
  Filled 2013-01-26: qty 2

## 2013-01-26 MED ORDER — HYDROCODONE-ACETAMINOPHEN 5-325 MG PO TABS: 2.0000 | ORAL_TABLET | Freq: Four times a day (QID) | ORAL | Status: DC | PRN

## 2013-01-26 MED ORDER — FENTANYL CITRATE 0.05 MG/ML IJ SOLN
INTRAMUSCULAR | Status: DC | PRN
  Administered 2013-01-26 (×2): 50 ug via INTRAVENOUS

## 2013-01-26 MED ORDER — BUPIVACAINE HCL (PF) 0.5 % IJ SOLN
INTRAMUSCULAR | Status: AC
  Filled 2013-01-26: qty 30

## 2013-01-26 MED ORDER — FENTANYL CITRATE 0.05 MG/ML IJ SOLN
INTRAMUSCULAR | Status: AC
  Filled 2013-01-26: qty 5

## 2013-01-26 MED ORDER — PROPOFOL 10 MG/ML IV EMUL
INTRAVENOUS | Status: AC
  Filled 2013-01-26: qty 20

## 2013-01-26 MED ORDER — LIDOCAINE HCL (CARDIAC) 20 MG/ML IV SOLN
INTRAVENOUS | Status: DC | PRN
  Administered 2013-01-26: 30 mg via INTRAVENOUS

## 2013-01-26 MED ORDER — KETOROLAC TROMETHAMINE 30 MG/ML IJ SOLN
INTRAMUSCULAR | Status: AC
  Filled 2013-01-26: qty 1

## 2013-01-26 MED ORDER — ONDANSETRON HCL 4 MG/2ML IJ SOLN
INTRAMUSCULAR | Status: AC
  Filled 2013-01-26: qty 2

## 2013-01-26 MED ORDER — VASOPRESSIN 20 UNIT/ML IJ SOLN
INTRAMUSCULAR | Status: DC | PRN
  Administered 2013-01-26: 20 [IU]

## 2013-01-26 MED ORDER — CHLOROPROCAINE HCL 1 % IJ SOLN
INTRAMUSCULAR | Status: AC
  Filled 2013-01-26: qty 30

## 2013-01-26 MED ORDER — MIDAZOLAM HCL 5 MG/5ML IJ SOLN
INTRAMUSCULAR | Status: DC | PRN
  Administered 2013-01-26: 2 mg via INTRAVENOUS

## 2013-01-26 MED ORDER — LACTATED RINGERS IV SOLN
INTRAVENOUS | Status: DC
  Administered 2013-01-26 (×3): via INTRAVENOUS

## 2013-01-26 MED ORDER — SODIUM CHLORIDE 0.9 % IR SOLN
Status: DC | PRN
  Administered 2013-01-26: 3000 mL

## 2013-01-26 MED ORDER — DEXAMETHASONE SODIUM PHOSPHATE 4 MG/ML IJ SOLN
INTRAMUSCULAR | Status: DC | PRN
  Administered 2013-01-26: 5 mg via INTRAVENOUS

## 2013-01-26 MED ORDER — BUPIVACAINE HCL 0.5 % IJ SOLN
INTRAMUSCULAR | Status: DC | PRN
  Administered 2013-01-26: 30 mL

## 2013-01-26 MED ORDER — VASOPRESSIN 20 UNIT/ML IJ SOLN
INTRAMUSCULAR | Status: AC
  Filled 2013-01-26: qty 1

## 2013-01-26 MED ORDER — PROPOFOL 10 MG/ML IV BOLUS
INTRAVENOUS | Status: DC | PRN
  Administered 2013-01-26: 150 mg via INTRAVENOUS
  Administered 2013-01-26: 50 mg via INTRAVENOUS

## 2013-01-26 SURGICAL SUPPLY — 25 items
BLADE INCISOR TRUC PLUS 2.9 (ABLATOR) IMPLANT
CANISTERS HI-FLOW 3000CC (CANNISTER) ×4 IMPLANT
CATH ROBINSON RED A/P 16FR (CATHETERS) ×2 IMPLANT
CLOTH BEACON ORANGE TIMEOUT ST (SAFETY) ×2 IMPLANT
CONTAINER PREFILL 10% NBF 60ML (FORM) ×4 IMPLANT
DRAPE HYSTEROSCOPY (DRAPE) ×2 IMPLANT
DRESSING TELFA 8X3 (GAUZE/BANDAGES/DRESSINGS) ×2 IMPLANT
ELECT REM PT RETURN 9FT ADLT (ELECTROSURGICAL)
ELECTRODE REM PT RTRN 9FT ADLT (ELECTROSURGICAL) IMPLANT
GLOVE BIO SURGEON STRL SZ 6.5 (GLOVE) ×4 IMPLANT
GLOVE BIOGEL PI IND STRL 7.0 (GLOVE) ×1 IMPLANT
GLOVE BIOGEL PI INDICATOR 7.0 (GLOVE) ×1
GOWN STRL REIN XL XLG (GOWN DISPOSABLE) ×4 IMPLANT
INCISOR TRUC PLUS BLADE 2.9 (ABLATOR)
KIT HYSTEROSCOPY TRUCLEAR (ABLATOR) ×1 IMPLANT
MORCELLATOR RECIP TRUCLEAR 4.0 (ABLATOR) IMPLANT
NDL SPNL 22GX3.5 QUINCKE BK (NEEDLE) ×1 IMPLANT
NEEDLE SPNL 22GX3.5 QUINCKE BK (NEEDLE) ×2 IMPLANT
PACK VAGINAL MINOR WOMEN LF (CUSTOM PROCEDURE TRAY) ×2 IMPLANT
PAD OB MATERNITY 4.3X12.25 (PERSONAL CARE ITEMS) ×2 IMPLANT
STENT BALLN UTERINE 3CM 6FR (Stent) IMPLANT
STENT BALLN UTERINE 4CM 6FR (STENTS) IMPLANT
SYR CONTROL 10ML LL (SYRINGE) ×2 IMPLANT
TOWEL OR 17X24 6PK STRL BLUE (TOWEL DISPOSABLE) ×4 IMPLANT
WATER STERILE IRR 1000ML POUR (IV SOLUTION) ×2 IMPLANT

## 2013-01-26 NOTE — Brief Op Note (Signed)
01/26/2013  8:32 AM  PATIENT:  Joan Cobb  52 y.o. female  PRE-OPERATIVE DIAGNOSIS:  Fibroids, Menorrhagia  [id number]  POST-OPERATIVE DIAGNOSIS:  Fibroids,Menorrhagia  PROCEDURE:  Procedure(s): DILATATION & CURETTAGE/HYSTEROSCOPY WITH TRUECLEAR; Hysteroscopic Myomectomy (N/A), polypectomy  SURGEON:  Surgeon(s) and Role:    * Kalliopi Coupland A. Ernestina Penna, MD - Primary  PHYSICIAN ASSISTANT:   ASSISTANTS: none   ANESTHESIA:   local and general  EBL:  Total I/O In: 1000 [I.V.:1000] Out: -   BLOOD ADMINISTERED:none  DRAINS: none   LOCAL MEDICATIONS USED:  OTHER 30cc 1/2 % marcaine mixed with 6 units (0.3cc) vasopressin- total 20 cc used, split btwn 5 and  7 o'clock cervico-paracervical junction.   SPECIMEN:  Source of Specimen:  polyp, fibroid, endometrial resection  DISPOSITION OF SPECIMEN:  PATHOLOGY  COUNTS:  YES  TOURNIQUET:  * No tourniquets in log *  DICTATION: .Note written in EPIC  PLAN OF CARE: Discharge to home after PACU  PATIENT DISPOSITION:  PACU - hemodynamically stable.   Delay start of Pharmacological VTE agent (>24hrs) due to surgical blood loss or risk of bleeding: yes

## 2013-01-26 NOTE — Anesthesia Postprocedure Evaluation (Signed)
Anesthesia Post Note  Patient: Joan Cobb  Procedure(s) Performed: Procedure(s) (LRB): HYSTEROSCOPY WITH TRUECLEAR; Hysteroscopic Myomectomy,polypectomy (N/A)  Anesthesia type: General  Patient location: PACU  Post pain: Pain level controlled  Post assessment: Post-op Vital signs reviewed  Last Vitals:  Filed Vitals:   01/26/13 0626  BP: 118/76  Pulse: 85  Temp: 36.9 C  Resp: 16    Post vital signs: Reviewed  Level of consciousness: sedated  Complications: No apparent anesthesia complications

## 2013-01-26 NOTE — Op Note (Signed)
01/26/2013  8:32 AM  PATIENT:  Joan Cobb  52 y.o. female  PRE-OPERATIVE DIAGNOSIS:  Fibroids, Menorrhagia  [id number]  POST-OPERATIVE DIAGNOSIS:  Fibroids,Menorrhagia  PROCEDURE:  Procedure(s): DILATATION & CURETTAGE/HYSTEROSCOPY WITH TRUECLEAR; Hysteroscopic Myomectomy (N/A), polypectomy  SURGEON:  Surgeon(s) and Role:    * Venissa Nappi A. Ernestina Penna, MD - Primary  PHYSICIAN ASSISTANT:   ASSISTANTS: none   ANESTHESIA:   local and general  EBL:  Total I/O In: 1000 [I.V.:1000] Out: -   BLOOD ADMINISTERED:none  DRAINS: none   LOCAL MEDICATIONS USED:  OTHER 30cc 1/2 % marcaine mixed with 6 units (0.3cc) vasopressin- total 20 cc used, split btwn 5 and  7 o'clock cervico-paracervical junction.   SPECIMEN:  Source of Specimen:  polyp, fibroid, endometrial resection  DISPOSITION OF SPECIMEN:  PATHOLOGY  COUNTS:  YES  TOURNIQUET:  * No tourniquets in log *  DICTATION: .Note written in EPIC  PLAN OF CARE: Discharge to home after PACU  PATIENT DISPOSITION:  PACU - hemodynamically stable.   Delay start of Pharmacological VTE agent (>24hrs) due to surgical blood loss or risk of bleeding: yes  Indications: menorrhagia, dysmennorhea, intracavitary fibroid, endometrial polyp, anemia Findings: 1.5 cm ant wall fibroid, intracavitary; 1 cm flat post wall fibroid, b/l ostia visualized.    After informed consent including discussion of risks of bleeding, infection, failure to successfully remove all pathology, uterine perforation and need for more aggressive surgery, the patient was taken to the operating room where general anesthesia was initiated without difficulty. She was prepped and draped in normal sterile fashion in the dorsal supine lithotomy position. No catheterization was done as the pt voided prior to moving back to the OR. A bimanual examination was done to assess the size and position of the uterus. A speculum was placed in the vagina and single tooth tenaculum used to grasp the  ant lip of the cervix. Anasthetic was injected. Cervix was dilated to a #29 Shawnie Pons. The TruClear obturator was inserted, and the camera inserted. Visualization with findgins as above. The TruClear blade was used to remove the 1.5 cm anterior wall fibroid. Great visualization throughout. The posterior polyp was also removed with the TruClear device with good visualization and hemostasis. Several other areas of the uterus with fluffy endometrial were noticed and a visual D&C was done. All tissue removed through the TruClear device. Hemostasis was assurred, the device was then removed. Tenaculum was removed. The tenaculum site was hemostatic with pressure and silver nitrate and the case was terminated.  Pt tolerated the procedure well. Sponge , lap and needle counts correct x 3 and pt to recovery room in stable condition.   Gerarda Conklin A. 01/26/2013 8:44 AM

## 2013-01-26 NOTE — H&P (Signed)
See scaned docs for H&P.  Intracavitary fibroid, for resection  PMH anemia, asthma.  Michaelann Gunnoe A. 01/26/2013 7:35 AM

## 2013-01-26 NOTE — Transfer of Care (Signed)
Immediate Anesthesia Transfer of Care Note  Patient: Joan Cobb  Procedure(s) Performed: Procedure(s): DILATATION & CURETTAGE/HYSTEROSCOPY WITH TRUECLEAR; Hysteroscopic Myomectomy (N/A)  Patient Location: PACU  Anesthesia Type:General  Level of Consciousness: awake and alert   Airway & Oxygen Therapy: Patient Spontanous Breathing and Patient connected to nasal cannula oxygen  Post-op Assessment: Report given to PACU RN and Post -op Vital signs reviewed and stable  Post vital signs: Reviewed and stable  Complications: No apparent anesthesia complications

## 2013-01-26 NOTE — Anesthesia Preprocedure Evaluation (Signed)
Anesthesia Evaluation  Patient identified by MRN, date of birth, ID band Patient awake    Reviewed: Allergy & Precautions, H&P , NPO status , Patient's Chart, lab work & pertinent test results, reviewed documented beta blocker date and time   History of Anesthesia Complications Negative for: history of anesthetic complications  Airway Mallampati: II TM Distance: >3 FB Neck ROM: full    Dental  (+) Teeth Intact   Pulmonary asthma (rescue inhaler use related to allergies, using frequently right now) , Recent URI  (allergies - congestion, irritated cough),  Seasonal allergies breath sounds clear to auscultation  Pulmonary exam normal       Cardiovascular negative cardio ROS  Rhythm:regular Rate:Normal     Neuro/Psych negative neurological ROS  negative psych ROS   GI/Hepatic Neg liver ROS, GERD- (occasional OTC med)  ,Gastritis, gastric/colon polyps   Endo/Other  negative endocrine ROS  Renal/GU negative Renal ROS  Female GU complaint     Musculoskeletal   Abdominal   Peds  Hematology  (+) anemia ,   Anesthesia Other Findings   Reproductive/Obstetrics negative OB ROS                           Anesthesia Physical Anesthesia Plan  ASA: II  Anesthesia Plan: General LMA   Post-op Pain Management:    Induction:   Airway Management Planned:   Additional Equipment:   Intra-op Plan:   Post-operative Plan:   Informed Consent: I have reviewed the patients History and Physical, chart, labs and discussed the procedure including the risks, benefits and alternatives for the proposed anesthesia with the patient or authorized representative who has indicated his/her understanding and acceptance.   Dental Advisory Given  Plan Discussed with: CRNA and Surgeon  Anesthesia Plan Comments:         Anesthesia Quick Evaluation

## 2013-05-09 ENCOUNTER — Other Ambulatory Visit: Payer: Self-pay | Admitting: Obstetrics

## 2013-05-19 ENCOUNTER — Other Ambulatory Visit: Payer: 59

## 2013-05-29 ENCOUNTER — Other Ambulatory Visit: Payer: 59

## 2013-05-30 ENCOUNTER — Other Ambulatory Visit: Payer: Self-pay | Admitting: Obstetrics

## 2013-06-14 ENCOUNTER — Ambulatory Visit
Admission: RE | Admit: 2013-06-14 | Discharge: 2013-06-14 | Disposition: A | Discharge status: Home / Self Care | Payer: 59 | Source: Ambulatory Visit | Attending: Obstetrics | Admitting: Obstetrics

## 2013-10-16 ENCOUNTER — Other Ambulatory Visit: Payer: Self-pay | Admitting: Obstetrics

## 2013-10-16 DIAGNOSIS — N6002 Solitary cyst of left breast: Secondary | ICD-10-CM

## 2013-10-26 ENCOUNTER — Ambulatory Visit
Admission: RE | Admit: 2013-10-26 | Discharge: 2013-10-26 | Disposition: A | Discharge status: Home / Self Care | Payer: 59 | Source: Ambulatory Visit | Attending: Obstetrics | Admitting: Obstetrics

## 2013-10-26 DIAGNOSIS — N6002 Solitary cyst of left breast: Secondary | ICD-10-CM

## 2013-12-11 ENCOUNTER — Other Ambulatory Visit: Payer: Self-pay | Admitting: Obstetrics

## 2013-12-15 ENCOUNTER — Encounter (HOSPITAL_COMMUNITY): Payer: Self-pay | Admitting: Pharmacist

## 2013-12-21 ENCOUNTER — Encounter (HOSPITAL_COMMUNITY): Payer: Self-pay

## 2013-12-21 ENCOUNTER — Encounter (HOSPITAL_COMMUNITY)
Admission: RE | Admit: 2013-12-21 | Discharge: 2013-12-21 | Disposition: A | Discharge status: Home / Self Care | Payer: 59 | Source: Ambulatory Visit | Attending: Obstetrics | Admitting: Obstetrics

## 2013-12-21 HISTORY — DX: Horseshoe tear of retina without detachment, left eye: H33.312

## 2013-12-21 HISTORY — DX: Other complications of anesthesia, initial encounter: T88.59XA

## 2013-12-21 HISTORY — DX: Adverse effect of unspecified anesthetic, initial encounter: T41.45XA

## 2013-12-21 LAB — BASIC METABOLIC PANEL
BUN: 12 mg/dL (ref 6–23)
CALCIUM: 9.2 mg/dL (ref 8.4–10.5)
CO2: 24 meq/L (ref 19–32)
CREATININE: 0.77 mg/dL (ref 0.50–1.10)
Chloride: 105 mEq/L (ref 96–112)
GFR calc Af Amer: >90 mL/min (ref >90)
Glucose, Bld: 96 mg/dL (ref 70–99)
Potassium: 4.3 mEq/L (ref 3.7–5.3)
SODIUM: 139 meq/L (ref 137–147)

## 2013-12-21 LAB — CBC
HEMATOCRIT: 41.5 % (ref 36.0–46.0)
HEMOGLOBIN: 14 g/dL (ref 12.0–15.0)
MCH: 30.5 pg (ref 26.0–34.0)
MCHC: 33.7 g/dL (ref 30.0–36.0)
MCV: 90.4 fL (ref 78.0–100.0)
Platelets: 255 10*3/uL (ref 150–400)
RBC: 4.59 MIL/uL (ref 3.87–5.11)
RDW: 13.6 % (ref 11.5–15.5)
WBC: 7.2 10*3/uL (ref 4.0–10.5)

## 2013-12-21 LAB — TYPE AND SCREEN
ABO/RH(D): B POS
ANTIBODY SCREEN: NEGATIVE

## 2013-12-21 NOTE — Patient Instructions (Signed)
[place]  12/21/2013   Your procedure is scheduled on:  12/22/13  Enter through the Main Entrance of [facility] at [place] up the phone at the desk and dial [phone].   Call this number if you have problems the morning of surgery: [phone]   Remember:   Do not eat food:After Midnight.  Do not drink clear liquids: 6 Hours before arrival.  Take these medicines the morning of surgery with A SIP OF WATER: May take Xanax if needed   Do not wear jewelry, make-up or nail polish.  Do not wear lotions, powders, or perfumes. You may wear deodorant.  Do not shave 48 hours prior to surgery.  Do not bring valuables to the hospital.  [facility] is not   responsible for any belongings or valuables brought to the hospital.  Contacts, dentures or bridgework may not be worn into surgery.  Leave suitcase in the car. After surgery it may be brought to your room.  For patients admitted to the hospital, checkout time is 11:00 AM the day of              discharge.   Patients discharged the day of surgery will not be allowed to drive             home.  Name and phone number of your driver: NA  Special Instructions:      Please read over the following fact sheets that you were given:   Surgical Site Infection Prevention

## 2013-12-22 ENCOUNTER — Ambulatory Visit (HOSPITAL_COMMUNITY)
Admission: RE | Admit: 2013-12-22 | Discharge: 2013-12-23 | Disposition: A | Discharge status: Home / Self Care | Payer: 59 | Source: Ambulatory Visit | Attending: Obstetrics | Admitting: Obstetrics

## 2013-12-22 ENCOUNTER — Encounter (HOSPITAL_COMMUNITY)
Admission: RE | Disposition: A | Discharge status: Home / Self Care | Payer: Self-pay | Source: Ambulatory Visit | Attending: Obstetrics

## 2013-12-22 ENCOUNTER — Ambulatory Visit (HOSPITAL_COMMUNITY): Payer: 59 | Admitting: Anesthesiology

## 2013-12-22 ENCOUNTER — Encounter (HOSPITAL_COMMUNITY): Payer: Self-pay | Admitting: Registered Nurse

## 2013-12-22 ENCOUNTER — Encounter (HOSPITAL_COMMUNITY): Payer: 59 | Admitting: Anesthesiology

## 2013-12-22 DIAGNOSIS — D252 Subserosal leiomyoma of uterus: Secondary | ICD-10-CM | POA: Insufficient documentation

## 2013-12-22 DIAGNOSIS — J45909 Unspecified asthma, uncomplicated: Secondary | ICD-10-CM | POA: Insufficient documentation

## 2013-12-22 DIAGNOSIS — N8502 Endometrial intraepithelial neoplasia [EIN]: Secondary | ICD-10-CM | POA: Insufficient documentation

## 2013-12-22 DIAGNOSIS — N8000 Endometriosis of the uterus, unspecified: Secondary | ICD-10-CM | POA: Insufficient documentation

## 2013-12-22 DIAGNOSIS — Z90710 Acquired absence of both cervix and uterus: Secondary | ICD-10-CM | POA: Diagnosis present

## 2013-12-22 DIAGNOSIS — D131 Benign neoplasm of stomach: Secondary | ICD-10-CM | POA: Insufficient documentation

## 2013-12-22 DIAGNOSIS — E538 Deficiency of other specified B group vitamins: Secondary | ICD-10-CM | POA: Insufficient documentation

## 2013-12-22 DIAGNOSIS — D251 Intramural leiomyoma of uterus: Secondary | ICD-10-CM | POA: Insufficient documentation

## 2013-12-22 DIAGNOSIS — N801 Endometriosis of ovary: Secondary | ICD-10-CM | POA: Insufficient documentation

## 2013-12-22 DIAGNOSIS — N80109 Endometriosis of ovary, unspecified side, unspecified depth: Secondary | ICD-10-CM | POA: Insufficient documentation

## 2013-12-22 DIAGNOSIS — D509 Iron deficiency anemia, unspecified: Secondary | ICD-10-CM | POA: Insufficient documentation

## 2013-12-22 HISTORY — PX: ROBOTIC ASSISTED TOTAL HYSTERECTOMY: SHX6085

## 2013-12-22 HISTORY — PX: BILATERAL SALPINGECTOMY: SHX5743

## 2013-12-22 LAB — PREGNANCY, URINE: Preg Test, Ur: NEGATIVE

## 2013-12-22 SURGERY — ROBOTIC ASSISTED TOTAL HYSTERECTOMY
Anesthesia: General

## 2013-12-22 MED ORDER — MENTHOL 3 MG MT LOZG
1.0000 | LOZENGE | OROMUCOSAL | Status: DC | PRN
Start: 2013-12-22 — End: 2013-12-23
  Filled 2013-12-22: qty 9

## 2013-12-22 MED ORDER — CEFAZOLIN SODIUM-DEXTROSE 2-3 GM-% IV SOLR
2.0000 g | INTRAVENOUS | Status: AC
  Administered 2013-12-22: 2 g via INTRAVENOUS

## 2013-12-22 MED ORDER — HEPARIN SODIUM (PORCINE) 5000 UNIT/ML IJ SOLN
INTRAMUSCULAR | Status: AC
  Filled 2013-12-22: qty 1

## 2013-12-22 MED ORDER — ROCURONIUM BROMIDE 100 MG/10ML IV SOLN
INTRAVENOUS | Status: DC | PRN
  Administered 2013-12-22: 50 mg via INTRAVENOUS
  Administered 2013-12-22: 20 mg via INTRAVENOUS

## 2013-12-22 MED ORDER — ONDANSETRON HCL 4 MG/2ML IJ SOLN
INTRAMUSCULAR | Status: AC
  Filled 2013-12-22: qty 2

## 2013-12-22 MED ORDER — ROCURONIUM BROMIDE 100 MG/10ML IV SOLN
INTRAVENOUS | Status: AC
  Filled 2013-12-22: qty 1

## 2013-12-22 MED ORDER — LIDOCAINE HCL (CARDIAC) 20 MG/ML IV SOLN
INTRAVENOUS | Status: DC | PRN
  Administered 2013-12-22: 50 mg via INTRAVENOUS

## 2013-12-22 MED ORDER — FENTANYL CITRATE 0.05 MG/ML IJ SOLN
INTRAMUSCULAR | Status: AC
  Filled 2013-12-22: qty 2

## 2013-12-22 MED ORDER — PROPOFOL 10 MG/ML IV BOLUS
INTRAVENOUS | Status: DC | PRN
  Administered 2013-12-22: 160 mg via INTRAVENOUS

## 2013-12-22 MED ORDER — MEPERIDINE HCL 25 MG/ML IJ SOLN: 6.2500 mg | INTRAMUSCULAR | Status: DC | PRN

## 2013-12-22 MED ORDER — DEXAMETHASONE SODIUM PHOSPHATE 10 MG/ML IJ SOLN
INTRAMUSCULAR | Status: AC
  Filled 2013-12-22: qty 1

## 2013-12-22 MED ORDER — HYDROMORPHONE HCL 2 MG PO TABS
2.0000 mg | ORAL_TABLET | ORAL | Status: DC | PRN
  Administered 2013-12-22: 2 mg via ORAL
  Filled 2013-12-22 (×3): qty 1

## 2013-12-22 MED ORDER — PROPOFOL 10 MG/ML IV EMUL
INTRAVENOUS | Status: AC
  Filled 2013-12-22: qty 20

## 2013-12-22 MED ORDER — ONDANSETRON HCL 4 MG PO TABS: 4.0000 mg | ORAL_TABLET | Freq: Four times a day (QID) | ORAL | Status: DC | PRN

## 2013-12-22 MED ORDER — KETOROLAC TROMETHAMINE 30 MG/ML IJ SOLN
30.0000 mg | Freq: Four times a day (QID) | INTRAMUSCULAR | Status: DC
  Administered 2013-12-22: 30 mg via INTRAVENOUS
  Filled 2013-12-22: qty 1

## 2013-12-22 MED ORDER — IBUPROFEN 600 MG PO TABS
600.0000 mg | ORAL_TABLET | Freq: Four times a day (QID) | ORAL | Status: DC | PRN
  Administered 2013-12-23 (×2): 600 mg via ORAL
  Filled 2013-12-22 (×2): qty 1

## 2013-12-22 MED ORDER — DEXAMETHASONE SODIUM PHOSPHATE 10 MG/ML IJ SOLN
INTRAMUSCULAR | Status: DC | PRN
  Administered 2013-12-22: 10 mg via INTRAVENOUS

## 2013-12-22 MED ORDER — FENTANYL CITRATE 0.05 MG/ML IJ SOLN
INTRAMUSCULAR | Status: AC
  Administered 2013-12-22: 50 ug via INTRAVENOUS
  Filled 2013-12-22: qty 2

## 2013-12-22 MED ORDER — LIDOCAINE HCL (CARDIAC) 20 MG/ML IV SOLN
INTRAVENOUS | Status: AC
  Filled 2013-12-22: qty 5

## 2013-12-22 MED ORDER — LACTATED RINGERS IV SOLN
INTRAVENOUS | Status: DC
  Administered 2013-12-22 (×2): via INTRAVENOUS

## 2013-12-22 MED ORDER — PANTOPRAZOLE SODIUM 40 MG PO TBEC
40.0000 mg | DELAYED_RELEASE_TABLET | Freq: Every day | ORAL | Status: DC
  Filled 2013-12-22: qty 1

## 2013-12-22 MED ORDER — MIDAZOLAM HCL 5 MG/5ML IJ SOLN
INTRAMUSCULAR | Status: DC | PRN
  Administered 2013-12-22: 2 mg via INTRAVENOUS

## 2013-12-22 MED ORDER — METOCLOPRAMIDE HCL 5 MG/ML IJ SOLN: 10.0000 mg | Freq: Once | INTRAMUSCULAR | Status: DC | PRN

## 2013-12-22 MED ORDER — ARTIFICIAL TEARS OP OINT
TOPICAL_OINTMENT | OPHTHALMIC | Status: AC
Start: 2013-12-22 — End: 2013-12-22
  Filled 2013-12-22: qty 3.5

## 2013-12-22 MED ORDER — ALBUTEROL SULFATE (2.5 MG/3ML) 0.083% IN NEBU: 2.5000 mg | INHALATION_SOLUTION | Freq: Four times a day (QID) | RESPIRATORY_TRACT | Status: DC | PRN

## 2013-12-22 MED ORDER — ONDANSETRON HCL 4 MG/2ML IJ SOLN: 4.0000 mg | Freq: Four times a day (QID) | INTRAMUSCULAR | Status: DC | PRN

## 2013-12-22 MED ORDER — KETOROLAC TROMETHAMINE 30 MG/ML IJ SOLN
INTRAMUSCULAR | Status: AC
  Filled 2013-12-22: qty 1

## 2013-12-22 MED ORDER — ONDANSETRON HCL 4 MG/2ML IJ SOLN
INTRAMUSCULAR | Status: DC | PRN
  Administered 2013-12-22: 4 mg via INTRAVENOUS

## 2013-12-22 MED ORDER — KETOROLAC TROMETHAMINE 30 MG/ML IJ SOLN
INTRAMUSCULAR | Status: DC | PRN
  Administered 2013-12-22: 30 mg via INTRAVENOUS

## 2013-12-22 MED ORDER — BUPIVACAINE HCL (PF) 0.25 % IJ SOLN
INTRAMUSCULAR | Status: AC
  Filled 2013-12-22: qty 20

## 2013-12-22 MED ORDER — MIDAZOLAM HCL 2 MG/2ML IJ SOLN
INTRAMUSCULAR | Status: AC
  Filled 2013-12-22: qty 2

## 2013-12-22 MED ORDER — NEOSTIGMINE METHYLSULFATE 10 MG/10ML IV SOLN
INTRAVENOUS | Status: DC | PRN
  Administered 2013-12-22: 2 mg via INTRAVENOUS

## 2013-12-22 MED ORDER — SODIUM CHLORIDE 0.9 % IV SOLN: INTRAVENOUS | Status: DC

## 2013-12-22 MED ORDER — BUPIVACAINE HCL (PF) 0.25 % IJ SOLN
INTRAMUSCULAR | Status: DC | PRN
  Administered 2013-12-22: 16 mL

## 2013-12-22 MED ORDER — FENTANYL CITRATE 0.05 MG/ML IJ SOLN
25.0000 ug | INTRAMUSCULAR | Status: DC | PRN
  Administered 2013-12-22 (×3): 50 ug via INTRAVENOUS

## 2013-12-22 MED ORDER — KETOROLAC TROMETHAMINE 30 MG/ML IJ SOLN: 30.0000 mg | Freq: Four times a day (QID) | INTRAMUSCULAR | Status: DC

## 2013-12-22 MED ORDER — LACTATED RINGERS IR SOLN
Status: DC | PRN
  Administered 2013-12-22: 3000 mL

## 2013-12-22 MED ORDER — HYDROMORPHONE HCL PF 1 MG/ML IJ SOLN: 1.0000 mg | INTRAMUSCULAR | Status: DC | PRN

## 2013-12-22 MED ORDER — FENTANYL CITRATE 0.05 MG/ML IJ SOLN
INTRAMUSCULAR | Status: DC | PRN
  Administered 2013-12-22 (×2): 50 ug via INTRAVENOUS
  Administered 2013-12-22: 150 ug via INTRAVENOUS
  Administered 2013-12-22: 50 ug via INTRAVENOUS

## 2013-12-22 MED ORDER — FENTANYL CITRATE 0.05 MG/ML IJ SOLN
INTRAMUSCULAR | Status: AC
  Filled 2013-12-22: qty 5

## 2013-12-22 MED ORDER — CEFAZOLIN SODIUM-DEXTROSE 2-3 GM-% IV SOLR
INTRAVENOUS | Status: AC
  Filled 2013-12-22: qty 50

## 2013-12-22 MED ORDER — GLYCOPYRROLATE 0.2 MG/ML IJ SOLN
INTRAMUSCULAR | Status: DC | PRN
  Administered 2013-12-22: 0.4 mg via INTRAVENOUS

## 2013-12-22 MED ORDER — KETOROLAC TROMETHAMINE 30 MG/ML IJ SOLN: 15.0000 mg | Freq: Once | INTRAMUSCULAR | Status: DC | PRN

## 2013-12-22 MED ORDER — ARTIFICIAL TEARS OP OINT
TOPICAL_OINTMENT | OPHTHALMIC | Status: DC | PRN
  Administered 2013-12-22: 1 via OPHTHALMIC

## 2013-12-22 MED ORDER — ACETAMINOPHEN 10 MG/ML IV SOLN
1000.0000 mg | Freq: Once | INTRAVENOUS | Status: AC
  Administered 2013-12-22: 1000 mg via INTRAVENOUS
  Filled 2013-12-22: qty 100

## 2013-12-22 SURGICAL SUPPLY — 67 items
ADH SKN CLS APL DERMABOND .7 (GAUZE/BANDAGES/DRESSINGS) ×2
BAG URINE DRAINAGE (UROLOGICAL SUPPLIES) ×3 IMPLANT
BARRIER ADHS 3X4 INTERCEED (GAUZE/BANDAGES/DRESSINGS) ×3 IMPLANT
BRR ADH 4X3 ABS CNTRL BYND (GAUZE/BANDAGES/DRESSINGS) ×2
CATH FOLEY 3WAY  5CC 16FR (CATHETERS) ×1
CATH FOLEY 3WAY 5CC 16FR (CATHETERS) ×2 IMPLANT
CHLORAPREP W/TINT 26ML (MISCELLANEOUS) ×3 IMPLANT
CLOTH BEACON ORANGE TIMEOUT ST (SAFETY) ×3 IMPLANT
CONT PATH 16OZ SNAP LID 3702 (MISCELLANEOUS) ×3 IMPLANT
COVER MAYO STAND STRL (DRAPES) ×3 IMPLANT
COVER TABLE BACK 60X90 (DRAPES) ×6 IMPLANT
COVER TIP SHEARS 8 DVNC (MISCELLANEOUS) ×2 IMPLANT
COVER TIP SHEARS 8MM DA VINCI (MISCELLANEOUS) ×1
DECANTER SPIKE VIAL GLASS SM (MISCELLANEOUS) ×3 IMPLANT
DERMABOND ADVANCED (GAUZE/BANDAGES/DRESSINGS) ×1
DERMABOND ADVANCED .7 DNX12 (GAUZE/BANDAGES/DRESSINGS) ×4 IMPLANT
DRAPE HUG U DISPOSABLE (DRAPE) ×3 IMPLANT
DRAPE LG THREE QUARTER DISP (DRAPES) ×6 IMPLANT
DRAPE WARM FLUID 44X44 (DRAPE) ×3 IMPLANT
ELECT REM PT RETURN 9FT ADLT (ELECTROSURGICAL) ×3
ELECTRODE REM PT RTRN 9FT ADLT (ELECTROSURGICAL) ×2 IMPLANT
EVACUATOR SMOKE 8.L (FILTER) ×3 IMPLANT
GAUZE VASELINE 3X9 (GAUZE/BANDAGES/DRESSINGS) IMPLANT
GLOVE BIO SURGEON STRL SZ 6.5 (GLOVE) ×3 IMPLANT
GLOVE BIOGEL PI IND STRL 7.0 (GLOVE) ×2 IMPLANT
GLOVE BIOGEL PI INDICATOR 7.0 (GLOVE) ×1
GOWN STRL REUS W/TWL LRG LVL3 (GOWN DISPOSABLE) ×18 IMPLANT
IV STOPCOCK 4 WAY 40  W/Y SET (IV SOLUTION) ×1
IV STOPCOCK 4 WAY 40 W/Y SET (IV SOLUTION) ×2 IMPLANT
KIT ACCESSORY DA VINCI DISP (KITS) ×1
KIT ACCESSORY DVNC DISP (KITS) ×2 IMPLANT
LEGGING LITHOTOMY PAIR STRL (DRAPES) ×3 IMPLANT
NEEDLE HYPO 22GX1.5 SAFETY (NEEDLE) IMPLANT
NEEDLE INSUFFLATION 120MM (ENDOMECHANICALS) IMPLANT
OCCLUDER COLPOPNEUMO (BALLOONS) ×1 IMPLANT
PACK LAVH (CUSTOM PROCEDURE TRAY) ×3 IMPLANT
PAD PREP 24X48 CUFFED NSTRL (MISCELLANEOUS) ×6 IMPLANT
PLUG CATH AND CAP STER (CATHETERS) ×3 IMPLANT
PROTECTOR NERVE ULNAR (MISCELLANEOUS) ×6 IMPLANT
SET CYSTO W/LG BORE CLAMP LF (SET/KITS/TRAYS/PACK) IMPLANT
SET IRRIG TUBING LAPAROSCOPIC (IRRIGATION / IRRIGATOR) ×6 IMPLANT
SOLUTION ELECTROLUBE (MISCELLANEOUS) ×3 IMPLANT
SUT VIC AB 0 CT1 27 (SUTURE) ×6
SUT VIC AB 0 CT1 27XBRD ANBCTR (SUTURE) ×4 IMPLANT
SUT VIC AB 0 CT1 27XBRD ANTBC (SUTURE) IMPLANT
SUT VIC AB 2-0 CT1 27 (SUTURE)
SUT VIC AB 2-0 CT1 TAPERPNT 27 (SUTURE) IMPLANT
SUT VIC AB 4-0 PS2 27 (SUTURE) ×6 IMPLANT
SUT VICRYL 0 27 CT2 27 ABS (SUTURE) IMPLANT
SUT VICRYL 0 UR6 27IN ABS (SUTURE) ×6 IMPLANT
SYR 50ML LL SCALE MARK (SYRINGE) ×3 IMPLANT
SYSTEM CONVERTIBLE TROCAR (TROCAR) IMPLANT
TIP RUMI ORANGE 6.7MMX12CM (TIP) IMPLANT
TIP UTERINE 5.1X6CM LAV DISP (MISCELLANEOUS) IMPLANT
TIP UTERINE 6.7X10CM GRN DISP (MISCELLANEOUS) ×1 IMPLANT
TIP UTERINE 6.7X6CM WHT DISP (MISCELLANEOUS) IMPLANT
TIP UTERINE 6.7X8CM BLUE DISP (MISCELLANEOUS) IMPLANT
TOWEL OR 17X24 6PK STRL BLUE (TOWEL DISPOSABLE) ×9 IMPLANT
TROCAR 12M 150ML BLUNT (TROCAR) ×1 IMPLANT
TROCAR DISP BLADELESS 8 DVNC (TROCAR) ×2 IMPLANT
TROCAR DISP BLADELESS 8MM (TROCAR) ×1
TROCAR HASSON GELL 12X100 (TROCAR) IMPLANT
TROCAR XCEL 12X100 BLDLESS (ENDOMECHANICALS) ×3 IMPLANT
TROCAR XCEL NON-BLD 5MMX100MML (ENDOMECHANICALS) ×3 IMPLANT
TUBING FILTER THERMOFLATOR (ELECTROSURGICAL) ×6 IMPLANT
WARMER LAPAROSCOPE (MISCELLANEOUS) ×3 IMPLANT
WATER STERILE IRR 1000ML POUR (IV SOLUTION) ×9 IMPLANT

## 2013-12-22 NOTE — Anesthesia Preprocedure Evaluation (Signed)
Anesthesia Evaluation  Patient identified by MRN, date of birth, ID band Patient awake    Reviewed: Allergy & Precautions, H&P , NPO status , Patient's Chart, lab work & pertinent test results, reviewed documented beta blocker date and time   History of Anesthesia Complications (+) history of anesthetic complications ("hyped up" after surgery in past (insomnia))  Airway Mallampati: II TM Distance: >3 FB Neck ROM: full    Dental  (+) Teeth Intact   Pulmonary asthma (with allergies - last inhaler use 3 weeks ago) ,  Allergic rhinitis breath sounds clear to auscultation  Pulmonary exam normal       Cardiovascular negative cardio ROS  Rhythm:regular Rate:Normal     Neuro/Psych negative neurological ROS  negative psych ROS   GI/Hepatic negative GI ROS, Neg liver ROS,   Endo/Other  negative endocrine ROS  Renal/GU negative Renal ROS     Musculoskeletal   Abdominal   Peds  Hematology negative hematology ROS (+)   Anesthesia Other Findings   Reproductive/Obstetrics negative OB ROS                           Anesthesia Physical Anesthesia Plan  ASA: II  Anesthesia Plan: General ETT   Post-op Pain Management:    Induction:   Airway Management Planned:   Additional Equipment:   Intra-op Plan:   Post-operative Plan:   Informed Consent: I have reviewed the patients History and Physical, chart, labs and discussed the procedure including the risks, benefits and alternatives for the proposed anesthesia with the patient or authorized representative who has indicated his/her understanding and acceptance.   Dental Advisory Given  Plan Discussed with: CRNA and Surgeon  Anesthesia Plan Comments:         Anesthesia Quick Evaluation

## 2013-12-22 NOTE — Transfer of Care (Signed)
Immediate Anesthesia Transfer of Care Note  Patient: Joan Cobb  Procedure(s) Performed: Procedure(s): ROBOTIC ASSISTED TOTAL HYSTERECTOMY With Pelvic Washings (N/A) BILATERAL SALPINGECTOMY (Bilateral)  Patient Location: PACU  Anesthesia Type:General  Level of Consciousness: awake  Airway & Oxygen Therapy: Patient Spontanous Breathing  Post-op Assessment: Report given to PACU RN  Post vital signs: stable  Filed Vitals:   12/22/13 1143  BP: 127/81  Pulse: 86  Temp: 36.8 C  Resp: 18    Complications: No apparent anesthesia complications

## 2013-12-22 NOTE — Op Note (Signed)
12/22/2013  4:42 PM  PATIENT:  Joan Cobb  53 y.o. female  PRE-OPERATIVE DIAGNOSIS:  Complex Atypical Hyperplasia  POST-OPERATIVE DIAGNOSIS:  Complex Atypical Hyperplasia  PROCEDURE:  Procedure(s): ROBOTIC ASSISTED TOTAL HYSTERECTOMY With Pelvic Washings (N/A) BILATERAL SALPINGECTOMY (Bilateral)  SURGEON:  Surgeon(s) and Role:    Claiborne Billings A. Pamala Hurry, MD - Primary    * Elveria Royals, MD - Assisting  PHYSICIAN ASSISTANT:   ASSISTANTSBenjie Karvonen, MD   ANESTHESIA:   general  EBL:  Total I/O In: 1000 [I.V.:1000] Out: 175 [Urine:150; Blood:25]  BLOOD ADMINISTERED:none  DRAINS: Urinary Catheter (Foley)   LOCAL MEDICATIONS USED:  MARCAINE     SPECIMEN:  Source of Specimen:  uterus, cervix, b/l fallopian tubes  DISPOSITION OF SPECIMEN:  PATHOLOGY  COUNTS:  YES  TOURNIQUET:  * No tourniquets in log *  DICTATION: .Note written in EPIC  PLAN OF CARE: Admit for overnight observation  PATIENT DISPOSITION:  PACU - hemodynamically stable.   Delay start of Pharmacological VTE agent (>24hrs) due to surgical blood loss or risk of bleeding: yes  Procedure:Robotic-assisted total laparoscopic hysterectomy  Findings: nl liver edge, nl gallbladder, nl appendix, nl b/l ovaries and fallopian tubes, fibroid uterus.   Indications: This is a 53 year old female with 2 year history of bothersome menorrhagia  And endometrial biopsy consistent with atypical complex hyperplasia. After all options d/w pt, decision made for hysterectomy w/ b/l salpingectomy and ovarian retention. Pt aware of recommendation for BSO if endometrial cancer present.   Procedure: After informed consent and discussion of R/B, the patient was taken to the operating room where general anesthesia was initiated without difficulty. She was prepped and draped in normal sterile fashion in the dorsal supine lithotomy position. A Foley catheter was inserted sterilely into the bladder. A bimanual examination was done to assess  the size and position of the uterus. A weighted speculum was placed in the vagina. A vaginal wall retractor was used on the anterior vagina and  the cervix was grasped with tenaculum. The cervix was sounded with the uterine sound. It sounded to 10 cm. The cervix was assessed to identify the Rumi-Co size.A small cup and an 10 cm shaft was used. This was easily threaded through the cervix, into the uterus and the cup seated nicely around the cervix. The uterine balloon was inflated.  Gloved were changed. Attention was then turned to the patient's abdomen. 0.5 % marcaine was used prior to all incision. A total of 20 cc of marcaine was used.  A 10 mm incision was made in the umbilicus and blunt and sharp dissection was done until the fascia was identified. This was then grasped with Kocher clamps x2 and entered sharply. A pursestring suture of 0 Vicryl was then placed along the incision and a non-bladed Sheryle Hail was inserted into the peritoneal cavity. Intraperitoneal placement was confirmed with the use of the camera and pneumoperitoneum was created to 15 mm of mercury. The pursestring suture was secured around the port and pneumoperitoneum was maintained. Brief survey of the abdomen and pelvis was done with findings as above. The abdominal wall was assessed and additional port sites were marked. 8 mm incisions were placed in the right (two) and left lower quadrants (1) and non-bladed ports were placed under direct visualization. An additional 5 mm incision was made in LLQ and 57mm non-bladed port was placed under direct visualization.  The robot was brought to the patient's side and attached with the right side docking. The robotic instruments were  placed under direct visualization until proper placement just over the uterus.  I then went to the robotic console. Brief survey of the patient's abdomen and pelvis revealed  findings as above. Bilateral ureters were identified and seen peristalsing well low in the pelvic  sidewall. Bilateral tubes and ovaries were normal. Small adhesions of the small bowel to the L adnexa were taken down sharply. I began on the right side: the  right  tube was serially cauterized with bipolar cautery with the use of the PK device and transected with the monopolar scissors. The  right utero-ovarian ligament was serially cauterized with the bipolar PK and transected with the monopolar scissors. The anterior leaf of the broad ligament was dissected bluntly then monopolar cautery was used to separate the anterior and posterior leafs of the broad ligament. This was carried down through to the bladder flap. Dissection w/ cautery of the posterior broad ligament was also done. Attention was then turned to the patient's  left  side: the  left t tube was cauterized and transected,  the  left  uterine ovarian ligament was serially cauterized with the PK and transected with the monopolar scissors. The left  broad ligament was opened up the anterior and posterior leaves were dissected apart from each other. Monopolar cautery was used to come across the anterior leaf of the right broad ligament. This dissection was carried down across to the midline to create the bladder flap. The left uterine artery was serially cauterized with the PK and transected with the monopolar scissors. The uterus was repositioned and the right uterine artery and then serially cauterized with the PK and transected with the monopolar scissors. The uterus was placed on stretch to allow better visualization of the arteries. The bladder flap was further taken down both sharply and with cautery. Cautery was used for hemostasis on several places along the bladder flap. At this point with the pressure inward on the Rumi, the anterior  colpotomy was made with the monopolar scissors. This was carried around to the patient's right side. Additional bipolar cautery was needed on the  both angles of the cuff- this was carried out with the PK bipolar.  The uterus was then positioned  anteriorly to allow easy access to the  posterior  colpotomy. This was carried out with the monopolar scissors.  Good hemostasis was noted along the angles of the incision.  The uterine manipulator with the uterus and cervix were delivered vaginally. Bipolar cautery was used along the colpotomy for hemostasis. Irrigation was used and the vaginal cuff appeared hemostatic.  The V-lock suture was placed through the umbilical port just after instruments were changed to allow a suture cut needle driver through the #1 port and and a long-tipped forceps through the #2 port. The right angle was closed with the V-lock which was then run across to the L angle and then back towards the midline. Good bites incorporating vaginal mucosa were sutured.  Excellent hemostasis was noted. Suction irrigation was carried out and hemostasis was assured along the vaginal cuff. The utero-ovarian ligaments were reassessed also found to be hemostatic. The suture was removed through the robotic ports.  All free fluid was removed from the abdomen. The robotic portion was completed. The robot was undocked.   By standard laparoscopy the pelvis was irrigated and evaluated. The patient was placed in reverse Trendelenburg, all additional fluid was suctioned from the abdomen and pelvis the cuff was reinspected and  found to be hemostatic. All pedicles were  also found to be hemostatic. Under direct visualization the ports were removed. Pneumoperitoneum was released and the umbilical port was removed.  The  pursestring suture at the umbilicus was then closed. No additional fascial incisions were closed due to the small size and the nondilated nature of these incisions. A 4-0 Vicryl was used to close the additional laparoscopic ports sites. Good hemostasis was noted.  Sponge lap and needle counts were correct x3 the patient was woken from general anesthesia having tolerated the procedure well and taken to the  recovery room in a stable fashion.

## 2013-12-22 NOTE — Brief Op Note (Signed)
12/22/2013  4:42 PM  PATIENT:  Joan Cobb  53 y.o. female  PRE-OPERATIVE DIAGNOSIS:  Complex Atypical Hyperplasia  POST-OPERATIVE DIAGNOSIS:  Complex Atypical Hyperplasia  PROCEDURE:  Procedure(s): ROBOTIC ASSISTED TOTAL HYSTERECTOMY With Pelvic Washings (N/A) BILATERAL SALPINGECTOMY (Bilateral)  SURGEON:  Surgeon(s) and Role:    Claiborne Billings A. Pamala Hurry, MD - Primary    * Elveria Royals, MD - Assisting  PHYSICIAN ASSISTANT:   ASSISTANTSBenjie Karvonen, MD   ANESTHESIA:   general  EBL:  Total I/O In: 1000 [I.V.:1000] Out: 175 [Urine:150; Blood:25]  BLOOD ADMINISTERED:none  DRAINS: Urinary Catheter (Foley)   LOCAL MEDICATIONS USED:  MARCAINE     SPECIMEN:  Source of Specimen:  uterus, cervix, b/l fallopian tubes  DISPOSITION OF SPECIMEN:  PATHOLOGY  COUNTS:  YES  TOURNIQUET:  * No tourniquets in log *  DICTATION: .Note written in EPIC  PLAN OF CARE: Admit for overnight observation  PATIENT DISPOSITION:  PACU - hemodynamically stable.   Delay start of Pharmacological VTE agent (>24hrs) due to surgical blood loss or risk of bleeding: yes

## 2013-12-22 NOTE — Anesthesia Postprocedure Evaluation (Signed)
  Anesthesia Post-op Note  Patient: Joan Cobb  Procedure(s) Performed: Procedure(s): ROBOTIC ASSISTED TOTAL HYSTERECTOMY With Pelvic Washings (N/A) BILATERAL SALPINGECTOMY (Bilateral) Last Vitals:  Filed Vitals:   12/22/13 1745  BP: 112/57  Pulse: 61  Temp: 36.7 C  Resp: 16    Patient is awake and responsive. Pain and nausea are reasonably well controlled. Vital signs are stable and clinically acceptable. Oxygen saturation is clinically acceptable. There are no apparent anesthetic complications at this time. Patient is ready for discharge.

## 2013-12-22 NOTE — H&P (Signed)
CC; endometrial complex atypical hyperplasia  See paper/ scanned H&P for full details.  In short, health perimenopausal pt w/ complex atypical hyperplasia on bx. Alternative d/w pt and opts for hyst w/ b/l salpingectomy.  PMH Past Medical History  Diagnosis Date  . Iron deficiency anemia   . Allergic rhinitis   . Adenomatous colon polyp 05/2007  . Asthma     with allergies  . Gastric polyps   . Vitamin B12 deficiency   . Complication of anesthesia     states she is hyperactive post op with every past surgery  . Retinal tear of left eye     Past Surgical History  Procedure Laterality Date  . Breast biopsy      left  . Toe surgery      left  . Tonsillectomy    . Wisdom tooth extraction Bilateral   . Left hand surgery    . Colonoscopy    . Polypectomy    . Vaginal deliveries  1990, 1995    Meds: iron, aygestin  A/P: hyst as above. O/n observation  Claiborne Billings A. Camile Esters 12/22/2013 1:46 PM

## 2013-12-23 LAB — CBC
HCT: 36.6 % (ref 36.0–46.0)
Hemoglobin: 12 g/dL (ref 12.0–15.0)
MCH: 29.8 pg (ref 26.0–34.0)
MCHC: 32.8 g/dL (ref 30.0–36.0)
MCV: 90.8 fL (ref 78.0–100.0)
PLATELETS: 225 10*3/uL (ref 150–400)
RBC: 4.03 MIL/uL (ref 3.87–5.11)
RDW: 13.6 % (ref 11.5–15.5)
WBC: 10.1 10*3/uL (ref 4.0–10.5)

## 2013-12-23 MED ORDER — IBUPROFEN 600 MG PO TABS: 600.0000 mg | ORAL_TABLET | Freq: Four times a day (QID) | ORAL | Status: DC | PRN

## 2013-12-23 MED ORDER — HYDROMORPHONE HCL 2 MG PO TABS: 2.0000 mg | ORAL_TABLET | ORAL | Status: DC | PRN

## 2013-12-23 NOTE — Progress Notes (Signed)
Pt d/c home with family, stable, ambulatory to private car. D/C instructions and medications reviewed with patient and family. Pt  Has a f/u appointment in 2 weeks. Pt verbalized understanding.

## 2013-12-23 NOTE — Progress Notes (Signed)
Kerensa L Joo  MRN: [id number]  DOB/AGE: 01/02/1961 52 y.o.  Admit date: 12/22/2013  Discharge date: 12/23/13  Admission Diagnoses: Complex Atypical Hyperplasia  Discharge Diagnoses: Complex Atypical Hyperplasia  Active Problems:  S/P hysterectomy  with bilateral salpingectomy  Discharged Condition: good  Hospital Course: admitted for robotic assisted TLH w/ b/l salpingectomy, uncomplicated surgery and post-op course.  On day of d/c pt notes minimal vaginal bleeding, good pain control w/ po meds, + flatus, + voids, tol po. Questions answered, post-op instructions reviewed.    Filed Vitals:     12/22/13 2026  12/22/13 2143  12/23/13 0119  12/23/13 0555    BP:  122/59  127/63  112/58  101/58    Pulse:  73  83  65  65    Temp:  97.7 F (36.5 C)  97.6 F (36.4 C)  98.2 F (36.8 C)  98.1 F (36.7 C)    TempSrc:  Oral  Oral  Oral  Oral    Resp:  18  18  18  18    Height:  5' 5" (1.651 m)       Weight:  67.586 kg (149 lb)       SpO2:  99%  100%  100%  97%     Gen: well, no distress  CV: RRR  Pulm: CTAB  Abd: soft, approp tender, ND, no tympany  Inc: C/D/I, dermabond  LE: SCD's in place, NT  Consults: None  Treatments: surgery: TLH  Disposition: 01-Home or Self Care        Medication List            albuterol 108 (90 BASE) MCG/ACT inhaler      Commonly known as: PROVENTIL HFA;VENTOLIN HFA      Inhale 2 puffs into the lungs every 6 (six) hours as needed for wheezing. With allergies      azelastine 0.1 % nasal spray      Commonly known as: ASTELIN      Place 1 spray into both nostrils daily. Use in each nostril as directed      carboxymethylcellulose 0.5 % Soln      Commonly known as: REFRESH PLUS      1 drop 3 (three) times daily as needed.      FERRALET 90 PO      Take 1 tablet by mouth every other day.      FLUTICASONE PROPIONATE (NASAL) NA      Place 2 sprays into the nose daily.      HYDROmorphone 2 MG tablet      Commonly known as: DILAUDID      Take 1 tablet (2 mg  total) by mouth every 2 (two) hours as needed for severe pain.      ibuprofen 600 MG tablet      Commonly known as: ADVIL,MOTRIN      Take 1 tablet (600 mg total) by mouth every 6 (six) hours as needed (mild pain).      norethindrone 5 MG tablet      Commonly known as: AYGESTIN      Take 5 mg by mouth daily. Until surgery          Signed:  Franz Svec A. Sisto Granillo, MD  12/23/2013, 10:21 AM          

## 2013-12-24 NOTE — Discharge Summary (Signed)
Joan Cobb  MRN: [id number]  DOB/AGE: 53-Feb-1962 53 y.o.  Admit date: 12/22/2013  Discharge date: 12/23/13  Admission Diagnoses: Complex Atypical Hyperplasia  Discharge Diagnoses: Complex Atypical Hyperplasia  Active Problems:  S/P hysterectomy  with bilateral salpingectomy  Discharged Condition: good  Hospital Course: admitted for robotic assisted TLH w/ b/l salpingectomy, uncomplicated surgery and post-op course.  On day of d/c pt notes minimal vaginal bleeding, good pain control w/ po meds, + flatus, + voids, tol po. Questions answered, post-op instructions reviewed.    Filed Vitals:     12/22/13 2026  12/22/13 2143  12/23/13 0119  12/23/13 0555    BP:  122/59  127/63  112/58  101/58    Pulse:  73  83  65  65    Temp:  97.7 F (36.5 C)  97.6 F (36.4 C)  98.2 F (36.8 C)  98.1 F (36.7 C)    TempSrc:  Oral  Oral  Oral  Oral    Resp:  18  18  18  18     Height:  5\' 5"  (1.651 m)       Weight:  67.586 kg (149 lb)       SpO2:  99%  100%  100%  97%     Gen: well, no distress  CV: RRR  Pulm: CTAB  Abd: soft, approp tender, ND, no tympany  Inc: C/D/I, dermabond  LE: SCD's in place, NT  Consults: None  Treatments: surgery: TLH  Disposition: 01-Home or Self Care        Medication List            albuterol 108 (90 BASE) MCG/ACT inhaler      Commonly known as: PROVENTIL HFA;VENTOLIN HFA      Inhale 2 puffs into the lungs every 6 (six) hours as needed for wheezing. With allergies      azelastine 0.1 % nasal spray      Commonly known as: ASTELIN      Place 1 spray into both nostrils daily. Use in each nostril as directed      carboxymethylcellulose 0.5 % Soln      Commonly known as: REFRESH PLUS      1 drop 3 (three) times daily as needed.      FERRALET 90 PO      Take 1 tablet by mouth every other day.      FLUTICASONE PROPIONATE (NASAL) NA      Place 2 sprays into the nose daily.      HYDROmorphone 2 MG tablet      Commonly known as: DILAUDID      Take 1 tablet (2 mg  total) by mouth every 2 (two) hours as needed for severe pain.      ibuprofen 600 MG tablet      Commonly known as: ADVIL,MOTRIN      Take 1 tablet (600 mg total) by mouth every 6 (six) hours as needed (mild pain).      norethindrone 5 MG tablet      Commonly known as: AYGESTIN      Take 5 mg by mouth daily. Until surgery          Signed:  Claiborne Billings A. Pamala Hurry, MD  12/23/2013, 10:21 AM

## 2013-12-25 ENCOUNTER — Encounter (HOSPITAL_COMMUNITY): Payer: Self-pay | Admitting: Obstetrics

## 2013-12-25 MED FILL — Heparin Sodium (Porcine) Inj 5000 Unit/ML: INTRAMUSCULAR | Qty: 1 | Status: AC

## 2014-01-15 ENCOUNTER — Other Ambulatory Visit: Payer: Self-pay | Admitting: Obstetrics

## 2014-01-15 DIAGNOSIS — N63 Unspecified lump in unspecified breast: Secondary | ICD-10-CM

## 2014-01-24 ENCOUNTER — Encounter (INDEPENDENT_AMBULATORY_CARE_PROVIDER_SITE_OTHER): Payer: Self-pay

## 2014-01-24 ENCOUNTER — Ambulatory Visit
Admission: RE | Admit: 2014-01-24 | Discharge: 2014-01-24 | Disposition: A | Discharge status: Home / Self Care | Payer: 59 | Source: Ambulatory Visit | Attending: Obstetrics | Admitting: Obstetrics

## 2014-01-24 ENCOUNTER — Other Ambulatory Visit: Payer: Self-pay | Admitting: Obstetrics

## 2014-01-24 DIAGNOSIS — N63 Unspecified lump in unspecified breast: Secondary | ICD-10-CM

## 2014-05-24 ENCOUNTER — Other Ambulatory Visit: Payer: Self-pay

## 2014-05-24 DIAGNOSIS — Z1239 Encounter for other screening for malignant neoplasm of breast: Secondary | ICD-10-CM

## 2014-06-25 ENCOUNTER — Other Ambulatory Visit: Payer: Self-pay

## 2014-06-25 DIAGNOSIS — Z1231 Encounter for screening mammogram for malignant neoplasm of breast: Secondary | ICD-10-CM

## 2014-06-27 ENCOUNTER — Ambulatory Visit
Admission: RE | Admit: 2014-06-27 | Discharge: 2014-06-27 | Disposition: A | Discharge status: Home / Self Care | Payer: 59 | Source: Ambulatory Visit

## 2014-06-27 ENCOUNTER — Encounter (INDEPENDENT_AMBULATORY_CARE_PROVIDER_SITE_OTHER): Payer: Self-pay

## 2014-06-27 DIAGNOSIS — Z1231 Encounter for screening mammogram for malignant neoplasm of breast: Secondary | ICD-10-CM

## 2014-08-10 HISTORY — PX: BREAST EXCISIONAL BIOPSY: SUR124

## 2014-10-08 ENCOUNTER — Encounter: Payer: Self-pay | Admitting: Gastroenterology

## 2014-10-31 ENCOUNTER — Encounter: Payer: Self-pay | Admitting: Gastroenterology

## 2014-12-05 ENCOUNTER — Ambulatory Visit (AMBULATORY_SURGERY_CENTER): Payer: Self-pay | Admitting: *Deleted

## 2014-12-05 VITALS — Ht 65.0 in | Wt 153.0 lb

## 2014-12-05 DIAGNOSIS — K2940 Chronic atrophic gastritis without bleeding: Secondary | ICD-10-CM

## 2014-12-05 NOTE — Progress Notes (Signed)
No egg or soy allergy Pt states she is hyper aware post op, she has awaken during and spoke to MD's during breast bx and wisdom teeth  No home 02 use No diet pills  no emmi per pt

## 2014-12-21 ENCOUNTER — Encounter: Payer: Self-pay | Admitting: Gastroenterology

## 2014-12-28 ENCOUNTER — Encounter: Payer: Self-pay | Admitting: Gastroenterology

## 2014-12-28 ENCOUNTER — Ambulatory Visit (AMBULATORY_SURGERY_CENTER): Payer: 59 | Admitting: Gastroenterology

## 2014-12-28 VITALS — BP 119/71 | HR 59 | Temp 97.7°F | Resp 17 | Ht 65.0 in | Wt 153.0 lb

## 2014-12-28 DIAGNOSIS — K3189 Other diseases of stomach and duodenum: Secondary | ICD-10-CM

## 2014-12-28 DIAGNOSIS — K2940 Chronic atrophic gastritis without bleeding: Secondary | ICD-10-CM

## 2014-12-28 DIAGNOSIS — K2950 Unspecified chronic gastritis without bleeding: Secondary | ICD-10-CM | POA: Diagnosis not present

## 2014-12-28 MED ORDER — SODIUM CHLORIDE 0.9 % IV SOLN: 500.0000 mL | INTRAVENOUS | Status: DC

## 2014-12-28 NOTE — Op Note (Signed)
[place]  [address] [place], [id number]   ENDOSCOPY PROCEDURE REPORT  PATIENT: Joan, Cobb  MR#: [id number] BIRTHDATE: 03-30-1961 , 79  yrs. old GENDER: female ENDOSCOPIST: Ladene Artist, MD, [facility] PROCEDURE DATE:  12/28/2014 PROCEDURE:  EGD w/ biopsy ASA CLASS:     Class II INDICATIONS:  Atrophic gastritis with intestinal metaplasia. MEDICATIONS: Monitored anesthesia care and Propofol 250 mg IV TOPICAL ANESTHETIC: none DESCRIPTION OF PROCEDURE: After the risks benefits and alternatives of the procedure were thoroughly explained, informed consent was obtained.  The LB [id number] endoscope was introduced through the mouth and advanced to the second portion of the duodenum , Without limitations.  The instrument was slowly withdrawn as the mucosa was fully examined.    STOMACH: Atrophic gastritis in the gastric fundus, and gastric body. Several small nodules in the body and fundus.  Multiple random biopsies were performed.  The mucosa of the stomach appeared normal.  Multiple random biopsies were performed. ESOPHAGUS: The mucosa of the esophagus appeared normal. DUODENUM: The duodenal mucosa showed no abnormalities in the bulb and 2nd part of the duodenum.  Retroflexed views revealed a small hiatal hernia.  The scope was then withdrawn from the patient and the procedure completed.  COMPLICATIONS: There were no immediate complications.  ENDOSCOPIC IMPRESSION: 1.   Atrophic gastritis in the gastric fundus, and gastric body; multiple random biopsies performed 2.   Small hiatal hernia  RECOMMENDATIONS: 1.  Await pathology results to determine follow up EGD timing  eSigned:  Ladene Artist, MD, [facility] 12/28/2014 2:47 PM

## 2014-12-28 NOTE — Progress Notes (Signed)
Transferred to PACU  NAD.  Report to Opal Sidles, [place]

## 2014-12-28 NOTE — Progress Notes (Signed)
Called to room to assist during endoscopic procedure.  Patient ID and intended procedure confirmed with present staff. Received instructions for my participation in the procedure from the performing physician.  

## 2014-12-28 NOTE — Patient Instructions (Signed)
YOU HAD AN ENDOSCOPIC PROCEDURE TODAY AT [facility]:   Refer to the procedure report that was given to you for any specific questions about what was found during the examination.  If the procedure report does not answer your questions, please call your gastroenterologist to clarify.  If you requested that your care partner not be given the details of your procedure findings, then the procedure report has been included in a sealed envelope for you to review at your convenience later.  YOU SHOULD EXPECT: Some feelings of bloating in the abdomen. Passage of more gas than usual.  Walking can help get rid of the air that was put into your GI tract during the procedure and reduce the bloating. If you had a lower endoscopy (such as a colonoscopy or flexible sigmoidoscopy) you may notice spotting of blood in your stool or on the toilet paper. If you underwent a bowel prep for your procedure, you may not have a normal bowel movement for a few days.  Please Note:  You might notice some irritation and congestion in your nose or some drainage.  This is from the oxygen used during your procedure.  There is no need for concern and it should clear up in a day or so.  SYMPTOMS TO REPORT IMMEDIATELY:     Following upper endoscopy (EGD)  Vomiting of blood or coffee ground material  New chest pain or pain under the shoulder blades  Painful or persistently difficult swallowing  New shortness of breath  Fever of 100F or higher  Black, tarry-looking stools  For urgent or emergent issues, a gastroenterologist can be reached at any hour by calling [phone].   DIET: Your first meal following the procedure should be a small meal and then it is ok to progress to your normal diet. Heavy or fried foods are harder to digest and may make you feel nauseous or bloated.  Likewise, meals heavy in dairy and vegetables can increase bloating.  Drink plenty of fluids but you should avoid alcoholic beverages  for 24 hours.  ACTIVITY:  You should plan to take it easy for the rest of today and you should NOT DRIVE or use heavy machinery until tomorrow (because of the sedation medicines used during the test).    FOLLOW UP: Our staff will call the number listed on your records the next business day following your procedure to check on you and address any questions or concerns that you may have regarding the information given to you following your procedure. If we do not reach you, we will leave a message.  However, if you are feeling well and you are not experiencing any problems, there is no need to return our call.  We will assume that you have returned to your regular daily activities without incident.  If any biopsies were taken you will be contacted by phone or by letter within the next 1-3 weeks.  Please call us at [phone] if you have not heard about the biopsies in 3 weeks.    SIGNATURES/CONFIDENTIALITY: You and/or your care partner have signed paperwork which will be entered into your electronic medical record.  These signatures attest to the fact that that the information above on your After Visit Summary has been reviewed and is understood.  Full responsibility of the confidentiality of this discharge information lies with you and/or your care-partner.  Gastrititis and hiatal hernia information given.

## 2014-12-31 ENCOUNTER — Telehealth: Payer: Self-pay

## 2014-12-31 NOTE — Telephone Encounter (Signed)
  Follow up Call-  Call back number 12/28/2014 10/21/2012  Post procedure Call Back phone  # [phone]  Permission to leave phone message Yes Yes     Patient questions:  Do you have a fever, pain , or abdominal swelling? No. Pain Score  0 *  Have you tolerated food without any problems? Yes.    Have you been able to return to your normal activities? Yes.    Do you have any questions about your discharge instructions: Diet   No. Medications  No. Follow up visit  No.  Do you have questions or concerns about your Care? No.  Actions: * If pain score is 4 or above: No action needed, pain <4.

## 2015-01-08 ENCOUNTER — Encounter: Payer: Self-pay | Admitting: Gastroenterology

## 2015-01-11 ENCOUNTER — Other Ambulatory Visit: Payer: Self-pay | Admitting: Obstetrics

## 2015-01-11 DIAGNOSIS — N644 Mastodynia: Secondary | ICD-10-CM

## 2015-01-11 DIAGNOSIS — N63 Unspecified lump in unspecified breast: Secondary | ICD-10-CM

## 2015-01-15 ENCOUNTER — Ambulatory Visit
Admission: RE | Admit: 2015-01-15 | Discharge: 2015-01-15 | Disposition: A | Discharge status: Home / Self Care | Payer: 59 | Source: Ambulatory Visit | Attending: Obstetrics | Admitting: Obstetrics

## 2015-01-15 ENCOUNTER — Other Ambulatory Visit: Payer: Self-pay | Admitting: Obstetrics

## 2015-01-15 DIAGNOSIS — N63 Unspecified lump in unspecified breast: Secondary | ICD-10-CM

## 2015-01-15 DIAGNOSIS — N644 Mastodynia: Secondary | ICD-10-CM

## 2015-01-17 ENCOUNTER — Other Ambulatory Visit: Payer: Self-pay | Admitting: Obstetrics

## 2015-01-17 DIAGNOSIS — N644 Mastodynia: Secondary | ICD-10-CM

## 2015-01-17 DIAGNOSIS — N63 Unspecified lump in unspecified breast: Secondary | ICD-10-CM

## 2015-01-21 ENCOUNTER — Ambulatory Visit
Admission: RE | Admit: 2015-01-21 | Discharge: 2015-01-21 | Disposition: A | Discharge status: Home / Self Care | Payer: 59 | Source: Ambulatory Visit | Attending: Obstetrics | Admitting: Obstetrics

## 2015-01-21 ENCOUNTER — Other Ambulatory Visit: Payer: Self-pay | Admitting: Obstetrics

## 2015-01-21 DIAGNOSIS — N644 Mastodynia: Secondary | ICD-10-CM

## 2015-01-21 DIAGNOSIS — N63 Unspecified lump in unspecified breast: Secondary | ICD-10-CM

## 2015-01-22 ENCOUNTER — Ambulatory Visit
Admission: RE | Admit: 2015-01-22 | Discharge: 2015-01-22 | Disposition: A | Discharge status: Home / Self Care | Payer: 59 | Source: Ambulatory Visit | Attending: Obstetrics | Admitting: Obstetrics

## 2015-01-22 DIAGNOSIS — N644 Mastodynia: Secondary | ICD-10-CM

## 2015-01-22 DIAGNOSIS — N63 Unspecified lump in unspecified breast: Secondary | ICD-10-CM

## 2015-02-07 ENCOUNTER — Other Ambulatory Visit: Payer: Self-pay | Admitting: General Surgery

## 2015-02-07 DIAGNOSIS — R928 Other abnormal and inconclusive findings on diagnostic imaging of breast: Secondary | ICD-10-CM

## 2015-02-08 ENCOUNTER — Other Ambulatory Visit: Payer: Self-pay | Admitting: General Surgery

## 2015-02-08 DIAGNOSIS — R928 Other abnormal and inconclusive findings on diagnostic imaging of breast: Secondary | ICD-10-CM

## 2015-02-15 ENCOUNTER — Encounter (HOSPITAL_BASED_OUTPATIENT_CLINIC_OR_DEPARTMENT_OTHER): Payer: Self-pay | Admitting: *Deleted

## 2015-02-18 ENCOUNTER — Ambulatory Visit
Admission: RE | Admit: 2015-02-18 | Discharge: 2015-02-18 | Disposition: A | Discharge status: Home / Self Care | Payer: 59 | Source: Ambulatory Visit | Attending: General Surgery | Admitting: General Surgery

## 2015-02-18 DIAGNOSIS — R928 Other abnormal and inconclusive findings on diagnostic imaging of breast: Secondary | ICD-10-CM

## 2015-02-19 ENCOUNTER — Encounter (HOSPITAL_BASED_OUTPATIENT_CLINIC_OR_DEPARTMENT_OTHER): Payer: Self-pay | Admitting: *Deleted

## 2015-02-19 NOTE — H&P (Signed)
Joan Cobb  Location: [facility] Patient #: [id number] DOB: Mar 25, 1961 Married / Language: English / Race: White Female       History of Present Illness  .  The patient is a 54 year old female who presents with a breast mass. This is a pleasant 54 year old Caucasian female, referred by Dr. Pamelia Hoit at the [facility] for evaluation and management of an abnormal mammogram of the right breast, upper inner quadrant. Dr. Aloha Gell is her gynecologist. Dr. Rachell Cipro is her PCP. The patient felt a lump in the upper outer quadrant of her right breast. Bilateral mammograms and right breast ultrasound were performed. Benign cystic process was noted in the upper outer quadrant. However, there was a focal area of architectural distortion in the right breast, upper inner quadrant, posterior depth. Image guided biopsy shows fibrocystic changes, sclerosing adenosis, but cannot rule out complex sclerosing lesion or radial scar. Surgical excision was recommended. Calcifications were noted. The patient had a left breast biopsy many years ago for benign disease. She's had cysts in her breast intermittently. No other history. Family history is negative for breast or ovarian or prostate cancer. Great-grandmother had mastectomy, but they do not know whether this was for cancer or not. Minimal comorbidities. Had LAVH without BSO. Seasonal allergies with asthma. Uses inhalers as needed. Basically healthy. Exercises regularly She is married. Her husband is with her throughout the encounter. She works as a Radio broadcast assistant. I explained the mammographic findings and the histologic changes to her and her husband. I told her there was a low but definite chance she could have early breast cancer. I agreed with recommendation for a conservative excision. She is in agreement. She'll be scheduled for left breast lumpectomy with radioactive seed localization. I discussed the  indications, details, techniques, and numerous risk of the surgery with her and her husband. She is aware the risk of bleeding, infection, cosmetic deformity, reoperation if this turns out to be cancer with a positive margin, and other unforeseen problems. She understands these issues well. At this time all of her questions were answered. She agrees with this plan.   Other Problems Anxiety Disorder General anesthesia - complications  Past Surgical History  Breast Biopsy Left. Foot Surgery Left. Hysterectomy (not due to cancer) - Partial Oral Surgery  Diagnostic Studies History  Allergies Colonoscopy 1-5 years ago Mammogram within last year Pap Smear 1-5 years ago  Percocet *ANALGESICS - OPIOID*  Medication History  Albuterol Sulfate (108 (90 Base)MCG/ACT Aero Pow Br Act, Inhalation) Active. Azelastine HCl (0.1% Solution, Nasal) Active. Carboxymethylcellulose Sodium (0.5% Solution, Ophthalmic) Active. Glucosamine-Chondroitin-MSM (500-400-166MG  Tablet, Oral) Active. Ketotifen Fumarate (0.025% Solution, Ophthalmic) Active. Naproxen Sodium (220MG  Capsule, Oral) Active. Nutritional Supp - Diet Aids (Oral) Active. Fluconazole (50MG  Tablet, Oral) Active. Zaditor (0.025% Solution, Ophthalmic) Active. Medications Reconciled  Social History  Alcohol use Moderate alcohol use. No caffeine use No drug use Tobacco use Never smoker.  Family History  Alcohol Abuse Father. Arthritis Mother. Depression Father. Heart Disease Father, Sister. Heart disease in female family member before age 60  Pregnancy / Birth History  Age at menarche 18 years. Gravida 2 Maternal age 66-30 Para 2  Review of Systems  General Present- Night Sweats. Not Present- Appetite Loss, Chills, Fatigue, Fever, Weight Gain and Weight Loss. HEENT Present- Seasonal Allergies and Wears glasses/contact lenses. Not Present- Earache, Hearing Loss, Hoarseness, Nose Bleed, Oral  Ulcers, Ringing in the Ears, Sinus Pain, Sore Throat, Visual Disturbances and Yellow Eyes. Breast Present- Breast Pain. Not Present-  Breast Mass, Nipple Discharge and Skin Changes. Musculoskeletal Not Present- Back Pain, Joint Pain, Joint Stiffness, Muscle Pain, Muscle Weakness and Swelling of Extremities. Neurological Not Present- Decreased Memory, Fainting, Headaches, Numbness, Seizures, Tingling, Tremor, Trouble walking and Weakness. Psychiatric Not Present- Anxiety, Bipolar, Change in Sleep Pattern, Depression, Fearful and Frequent crying. Endocrine Not Present- Cold Intolerance, Excessive Hunger, Hair Changes, Heat Intolerance, Hot flashes and New Diabetes. Hematology Not Present- Easy Bruising, Excessive bleeding, Gland problems, HIV and Persistent Infections.   Vitals   Weight: 154 lb Height: 65.5in Body Surface Area: 1.8 m Body Mass Index: 25.24 kg/m Temp.: 98.96F(Oral)  Pulse: 75 (Regular)  BP: 122/82 (Sitting, Left Arm, Standard)    Physical Exam General Mental Status-Alert. General Appearance-Consistent with stated age. Hydration-Well hydrated. Voice-Normal.  Head and Neck Head-normocephalic, atraumatic with no lesions or palpable masses. Trachea-midline. Thyroid Gland Characteristics - normal size and consistency.  Eye Eyeball - Bilateral-Extraocular movements intact. Sclera/Conjunctiva - Bilateral-No scleral icterus.  Chest and Lung Exam Chest and lung exam reveals -quiet, even and easy respiratory effort with no use of accessory muscles and on auscultation, normal breath sounds, no adventitious sounds and normal vocal resonance. Inspection Chest Wall - Normal. Back - normal.  Breast Note: Right breast reveals biopsy site superiorly. Minimal thickening of biopsy site. Minimal ecchymoses. Both breasts are lumpy but there is no dominant mass. Nipple and areola looked normal. No discharge. No axillary  mass.   Cardiovascular Cardiovascular examination reveals -normal heart sounds, regular rate and rhythm with no murmurs and normal pedal pulses bilaterally.  Abdomen Note: Soft and nontender. Well-healed trocar sites from laparoscopic hysterectomy.   Neurologic Neurologic evaluation reveals -alert and oriented x 3 with no impairment of recent or remote memory. Mental Status-Normal.  Musculoskeletal Normal Exam - Left-Upper Extremity Strength Normal and Lower Extremity Strength Normal. Normal Exam - Right-Upper Extremity Strength Normal and Lower Extremity Strength Normal.  Lymphatic Note: No cervical or axillary adenopathy.      Assessment & Plan  ABNORMAL MAMMOGRAM OF RIGHT BREAST (793.80  R92.8)   Schedule for Surgery Your recent imaging studies and right breast biopsy showed sclerosing adenosis and possible complex sclerosing lesion and possible radial scar. (These are specific pathologic terms) There is a low but definite chance this could be an early breast cancer. Most likely it is not. you will be scheduled for right breast lumpectomy with radioactive seed localization in the near future We've discussed the indications, techniques, and numerous risk of this surgery. Please read the written information that we have given you. Pt Education - [facility] Breast Biopsy HCI Pt Education - Incisional Breast Biopsy *: breast biopsy  MILD INTERMITTENT EXTRINSIC ASTHMA WITHOUT COMPLICATION (736.68  D59.47)    Signed by Adin Hector, MD (02/07/2015 12:13 PM.hmis

## 2015-02-20 ENCOUNTER — Ambulatory Visit (HOSPITAL_BASED_OUTPATIENT_CLINIC_OR_DEPARTMENT_OTHER): Payer: 59 | Admitting: Anesthesiology

## 2015-02-20 ENCOUNTER — Ambulatory Visit
Admission: RE | Admit: 2015-02-20 | Discharge: 2015-02-20 | Disposition: A | Discharge status: Home / Self Care | Payer: 59 | Source: Ambulatory Visit | Attending: General Surgery | Admitting: General Surgery

## 2015-02-20 ENCOUNTER — Encounter (HOSPITAL_BASED_OUTPATIENT_CLINIC_OR_DEPARTMENT_OTHER)
Admission: RE | Disposition: A | Discharge status: Home / Self Care | Payer: Self-pay | Source: Ambulatory Visit | Attending: General Surgery

## 2015-02-20 ENCOUNTER — Ambulatory Visit (HOSPITAL_BASED_OUTPATIENT_CLINIC_OR_DEPARTMENT_OTHER)
Admission: RE | Admit: 2015-02-20 | Discharge: 2015-02-20 | Disposition: A | Discharge status: Home / Self Care | Payer: 59 | Source: Ambulatory Visit | Attending: General Surgery | Admitting: General Surgery

## 2015-02-20 ENCOUNTER — Encounter (HOSPITAL_BASED_OUTPATIENT_CLINIC_OR_DEPARTMENT_OTHER): Payer: Self-pay | Admitting: Anesthesiology

## 2015-02-20 DIAGNOSIS — R928 Other abnormal and inconclusive findings on diagnostic imaging of breast: Secondary | ICD-10-CM | POA: Diagnosis present

## 2015-02-20 DIAGNOSIS — J4520 Mild intermittent asthma, uncomplicated: Secondary | ICD-10-CM | POA: Insufficient documentation

## 2015-02-20 DIAGNOSIS — Z791 Long term (current) use of non-steroidal anti-inflammatories (NSAID): Secondary | ICD-10-CM | POA: Diagnosis not present

## 2015-02-20 DIAGNOSIS — F419 Anxiety disorder, unspecified: Secondary | ICD-10-CM | POA: Insufficient documentation

## 2015-02-20 DIAGNOSIS — N6091 Unspecified benign mammary dysplasia of right breast: Secondary | ICD-10-CM | POA: Diagnosis not present

## 2015-02-20 HISTORY — PX: BREAST LUMPECTOMY WITH RADIOACTIVE SEED LOCALIZATION: SHX6424

## 2015-02-20 HISTORY — DX: Anxiety disorder, unspecified: F41.9

## 2015-02-20 LAB — POCT HEMOGLOBIN-HEMACUE: Hemoglobin: 15 g/dL (ref 12.0–15.0)

## 2015-02-20 SURGERY — BREAST LUMPECTOMY WITH RADIOACTIVE SEED LOCALIZATION
Anesthesia: General | Site: Breast | Laterality: Right

## 2015-02-20 MED ORDER — BUPIVACAINE-EPINEPHRINE (PF) 0.5% -1:200000 IJ SOLN
INTRAMUSCULAR | Status: DC | PRN
  Administered 2015-02-20: 10 mL via PERINEURAL

## 2015-02-20 MED ORDER — PROPOFOL 10 MG/ML IV BOLUS
INTRAVENOUS | Status: DC | PRN
  Administered 2015-02-20: 200 mg via INTRAVENOUS

## 2015-02-20 MED ORDER — HYDROMORPHONE HCL 1 MG/ML IJ SOLN
0.2500 mg | INTRAMUSCULAR | Status: DC | PRN
  Administered 2015-02-20: 0.5 mg via INTRAVENOUS

## 2015-02-20 MED ORDER — ACETAMINOPHEN 325 MG PO TABS: 650.0000 mg | ORAL_TABLET | ORAL | Status: DC | PRN

## 2015-02-20 MED ORDER — CEFAZOLIN SODIUM-DEXTROSE 2-3 GM-% IV SOLR
INTRAVENOUS | Status: AC
  Filled 2015-02-20: qty 50

## 2015-02-20 MED ORDER — MIDAZOLAM HCL 2 MG/2ML IJ SOLN
1.0000 mg | INTRAMUSCULAR | Status: DC | PRN
Start: 2015-02-20 — End: 2015-02-20
  Administered 2015-02-20: 2 mg via INTRAVENOUS

## 2015-02-20 MED ORDER — SODIUM CHLORIDE 0.9 % IV SOLN: 250.0000 mL | INTRAVENOUS | Status: DC | PRN

## 2015-02-20 MED ORDER — FENTANYL CITRATE (PF) 100 MCG/2ML IJ SOLN: 25.0000 ug | INTRAMUSCULAR | Status: DC | PRN

## 2015-02-20 MED ORDER — GLYCOPYRROLATE 0.2 MG/ML IJ SOLN: 0.2000 mg | Freq: Once | INTRAMUSCULAR | Status: DC | PRN

## 2015-02-20 MED ORDER — DEXAMETHASONE SODIUM PHOSPHATE 4 MG/ML IJ SOLN
INTRAMUSCULAR | Status: DC | PRN
  Administered 2015-02-20: 10 mg via INTRAVENOUS

## 2015-02-20 MED ORDER — CEFAZOLIN SODIUM-DEXTROSE 2-3 GM-% IV SOLR
2.0000 g | INTRAVENOUS | Status: AC
  Administered 2015-02-20: 2 g via INTRAVENOUS

## 2015-02-20 MED ORDER — SODIUM CHLORIDE 0.9 % IV SOLN: INTRAVENOUS | Status: DC

## 2015-02-20 MED ORDER — MEPERIDINE HCL 25 MG/ML IJ SOLN: 6.2500 mg | INTRAMUSCULAR | Status: DC | PRN

## 2015-02-20 MED ORDER — HYDROCODONE-ACETAMINOPHEN 5-325 MG PO TABS: 1.0000 | ORAL_TABLET | Freq: Four times a day (QID) | ORAL | Status: DC | PRN

## 2015-02-20 MED ORDER — ACETAMINOPHEN 650 MG RE SUPP: 650.0000 mg | RECTAL | Status: DC | PRN

## 2015-02-20 MED ORDER — SODIUM CHLORIDE 0.9 % IJ SOLN: 3.0000 mL | Freq: Two times a day (BID) | INTRAMUSCULAR | Status: DC

## 2015-02-20 MED ORDER — ONDANSETRON HCL 4 MG/2ML IJ SOLN
INTRAMUSCULAR | Status: DC | PRN
  Administered 2015-02-20: 4 mg via INTRAVENOUS

## 2015-02-20 MED ORDER — OXYCODONE HCL 5 MG PO TABS
5.0000 mg | ORAL_TABLET | ORAL | Status: DC | PRN
  Administered 2015-02-20: 5 mg via ORAL

## 2015-02-20 MED ORDER — SODIUM CHLORIDE 0.9 % IJ SOLN: 3.0000 mL | INTRAMUSCULAR | Status: DC | PRN

## 2015-02-20 MED ORDER — FENTANYL CITRATE (PF) 100 MCG/2ML IJ SOLN
INTRAMUSCULAR | Status: AC
  Filled 2015-02-20: qty 4

## 2015-02-20 MED ORDER — PROMETHAZINE HCL 25 MG/ML IJ SOLN: 6.2500 mg | INTRAMUSCULAR | Status: DC | PRN

## 2015-02-20 MED ORDER — CHLORHEXIDINE GLUCONATE 4 % EX LIQD: 1.0000 "application " | Freq: Once | CUTANEOUS | Status: DC

## 2015-02-20 MED ORDER — HYDROMORPHONE HCL 1 MG/ML IJ SOLN
INTRAMUSCULAR | Status: AC
  Filled 2015-02-20: qty 1

## 2015-02-20 MED ORDER — FENTANYL CITRATE (PF) 100 MCG/2ML IJ SOLN
50.0000 ug | INTRAMUSCULAR | Status: DC | PRN
  Administered 2015-02-20: 100 ug via INTRAVENOUS

## 2015-02-20 MED ORDER — LACTATED RINGERS IV SOLN
INTRAVENOUS | Status: DC
  Administered 2015-02-20: 10:00:00 via INTRAVENOUS

## 2015-02-20 MED ORDER — OXYCODONE HCL 5 MG PO TABS
ORAL_TABLET | ORAL | Status: AC
  Filled 2015-02-20: qty 1

## 2015-02-20 MED ORDER — LIDOCAINE HCL (CARDIAC) 20 MG/ML IV SOLN
INTRAVENOUS | Status: DC | PRN
  Administered 2015-02-20: 50 mg via INTRAVENOUS

## 2015-02-20 MED ORDER — MIDAZOLAM HCL 2 MG/2ML IJ SOLN
INTRAMUSCULAR | Status: AC
  Filled 2015-02-20: qty 2

## 2015-02-20 MED ORDER — SCOPOLAMINE 1 MG/3DAYS TD PT72: 1.0000 | MEDICATED_PATCH | Freq: Once | TRANSDERMAL | Status: DC | PRN

## 2015-02-20 SURGICAL SUPPLY — 64 items
ADH SKN CLS APL DERMABOND .7 (GAUZE/BANDAGES/DRESSINGS) ×1
APL SKNCLS STERI-STRIP NONHPOA (GAUZE/BANDAGES/DRESSINGS)
APPLIER CLIP 9.375 MED OPEN (MISCELLANEOUS)
APR CLP MED 9.3 20 MLT OPN (MISCELLANEOUS)
BENZOIN TINCTURE PRP APPL 2/3 (GAUZE/BANDAGES/DRESSINGS) IMPLANT
BINDER BREAST LRG (GAUZE/BANDAGES/DRESSINGS) IMPLANT
BINDER BREAST MEDIUM (GAUZE/BANDAGES/DRESSINGS) IMPLANT
BINDER BREAST XLRG (GAUZE/BANDAGES/DRESSINGS) IMPLANT
BINDER BREAST XXLRG (GAUZE/BANDAGES/DRESSINGS) IMPLANT
BLADE HEX COATED 2.75 (ELECTRODE) ×3 IMPLANT
BLADE SURG 10 STRL SS (BLADE) IMPLANT
BLADE SURG 15 STRL LF DISP TIS (BLADE) ×1 IMPLANT
BLADE SURG 15 STRL SS (BLADE) ×3
CANISTER SUC SOCK COL 7IN (MISCELLANEOUS) IMPLANT
CANISTER SUCT 1200ML W/VALVE (MISCELLANEOUS) ×3 IMPLANT
CHLORAPREP W/TINT 26ML (MISCELLANEOUS) ×3 IMPLANT
CLIP APPLIE 9.375 MED OPEN (MISCELLANEOUS) IMPLANT
CLOSURE WOUND 1/2 X4 (GAUZE/BANDAGES/DRESSINGS) ×1
COVER BACK TABLE 60X90IN (DRAPES) ×3 IMPLANT
COVER MAYO STAND STRL (DRAPES) ×3 IMPLANT
COVER PROBE W GEL 5X96 (DRAPES) ×3 IMPLANT
DECANTER SPIKE VIAL GLASS SM (MISCELLANEOUS) IMPLANT
DERMABOND ADVANCED (GAUZE/BANDAGES/DRESSINGS) ×2
DERMABOND ADVANCED .7 DNX12 (GAUZE/BANDAGES/DRESSINGS) ×1 IMPLANT
DEVICE DUBIN W/COMP PLATE 8390 (MISCELLANEOUS) ×3 IMPLANT
DRAPE LAPAROSCOPIC ABDOMINAL (DRAPES) ×3 IMPLANT
DRAPE UTILITY XL STRL (DRAPES) ×3 IMPLANT
DRSG PAD ABDOMINAL 8X10 ST (GAUZE/BANDAGES/DRESSINGS) IMPLANT
ELECT REM PT RETURN 9FT ADLT (ELECTROSURGICAL) ×3
ELECTRODE REM PT RTRN 9FT ADLT (ELECTROSURGICAL) ×1 IMPLANT
GLOVE BIOGEL PI IND STRL 8 (GLOVE) IMPLANT
GLOVE BIOGEL PI INDICATOR 8 (GLOVE) ×2
GLOVE EUDERMIC 7 POWDERFREE (GLOVE) ×3 IMPLANT
GLOVE SURG SS PI 7.5 STRL IVOR (GLOVE) ×2 IMPLANT
GOWN STRL REUS W/ TWL LRG LVL3 (GOWN DISPOSABLE) ×1 IMPLANT
GOWN STRL REUS W/ TWL XL LVL3 (GOWN DISPOSABLE) ×1 IMPLANT
GOWN STRL REUS W/TWL LRG LVL3 (GOWN DISPOSABLE) ×6
GOWN STRL REUS W/TWL XL LVL3 (GOWN DISPOSABLE) ×3
KIT MARKER MARGIN INK (KITS) ×3 IMPLANT
NDL HYPO 25X1 1.5 SAFETY (NEEDLE) ×1 IMPLANT
NEEDLE HYPO 25X1 1.5 SAFETY (NEEDLE) ×3 IMPLANT
NS IRRIG 1000ML POUR BTL (IV SOLUTION) ×3 IMPLANT
PACK BASIN DAY SURGERY FS (CUSTOM PROCEDURE TRAY) ×3 IMPLANT
PENCIL BUTTON HOLSTER BLD 10FT (ELECTRODE) ×3 IMPLANT
SHEET MEDIUM DRAPE 40X70 STRL (DRAPES) ×2 IMPLANT
SLEEVE SCD COMPRESS KNEE MED (MISCELLANEOUS) ×3 IMPLANT
SPONGE GAUZE 4X4 12PLY STER LF (GAUZE/BANDAGES/DRESSINGS) IMPLANT
SPONGE LAP 18X18 X RAY DECT (DISPOSABLE) IMPLANT
SPONGE LAP 4X18 X RAY DECT (DISPOSABLE) ×3 IMPLANT
STRIP CLOSURE SKIN 1/2X4 (GAUZE/BANDAGES/DRESSINGS) ×1 IMPLANT
SUT ETHILON 3 0 FSL (SUTURE) IMPLANT
SUT MNCRL AB 4-0 PS2 18 (SUTURE) ×3 IMPLANT
SUT SILK 2 0 SH (SUTURE) ×3 IMPLANT
SUT VIC AB 2-0 CT1 27 (SUTURE)
SUT VIC AB 2-0 CT1 TAPERPNT 27 (SUTURE) IMPLANT
SUT VIC AB 3-0 SH 27 (SUTURE)
SUT VIC AB 3-0 SH 27X BRD (SUTURE) IMPLANT
SUT VICRYL 3-0 CR8 SH (SUTURE) ×3 IMPLANT
SYRINGE 10CC LL (SYRINGE) ×3 IMPLANT
TOWEL OR 17X24 6PK STRL BLUE (TOWEL DISPOSABLE) ×3 IMPLANT
TOWEL OR NON WOVEN STRL DISP B (DISPOSABLE) IMPLANT
TUBE CONNECTING 20'X1/4 (TUBING) ×1
TUBE CONNECTING 20X1/4 (TUBING) ×2 IMPLANT
YANKAUER SUCT BULB TIP NO VENT (SUCTIONS) ×3 IMPLANT

## 2015-02-20 NOTE — Transfer of Care (Signed)
Immediate Anesthesia Transfer of Care Note  Patient: Joan Cobb  Procedure(s) Performed: Procedure(s): RIGHT BREAST LUMPECTOMY WITH RADIOACTIVE SEED LOCALIZATION (Right)  Patient Location: PACU  Anesthesia Type:General  Level of Consciousness: awake and sedated  Airway & Oxygen Therapy: Patient Spontanous Breathing and Patient connected to face mask oxygen  Post-op Assessment: Report given to RN and Post -op Vital signs reviewed and stable  Post vital signs: Reviewed and stable  Last Vitals:  Filed Vitals:   02/20/15 0936  BP: 141/74  Temp: 36.8 C  Resp: 20    Complications: No apparent anesthesia complications

## 2015-02-20 NOTE — Discharge Instructions (Signed)
[facility] °Office Phone Number [phone] ° °BREAST BIOPSY/ PARTIAL MASTECTOMY: POST OP INSTRUCTIONS ° °Always review your discharge instruction sheet given to you by the facility where your surgery was performed. ° °IF YOU HAVE DISABILITY OR FAMILY LEAVE FORMS, YOU MUST BRING THEM TO THE OFFICE FOR PROCESSING.  DO NOT GIVE THEM TO YOUR DOCTOR. ° °1. A prescription for pain medication may be given to you upon discharge.  Take your pain medication as prescribed, if needed.  If narcotic pain medicine is not needed, then you may take acetaminophen (Tylenol) or ibuprofen (Advil) as needed. °2. Take your usually prescribed medications unless otherwise directed °3. If you need a refill on your pain medication, please contact your pharmacy.  They will contact our office to request authorization.  Prescriptions will not be filled after 5pm or on week-ends. °4. You should eat very light the first 24 hours after surgery, such as soup, crackers, pudding, etc.  Resume your normal diet the day after surgery. °5. Most patients will experience some swelling and bruising in the breast.  Ice packs and a good support bra will help.  Swelling and bruising can take several days to resolve.  °6. It is common to experience some constipation if taking pain medication after surgery.  Increasing fluid intake and taking a stool softener will usually help or prevent this problem from occurring.  A mild laxative (Milk of Magnesia or Miralax) should be taken according to package directions if there are no bowel movements after 48 hours. °7. Unless discharge instructions indicate otherwise, you may remove your bandages 24-48 hours after surgery, and you may shower at that time.  You may have steri-strips (small skin tapes) in place directly over the incision.  These strips should be left on the skin for 7-10 days.  If your surgeon used skin glue on the incision, you may shower in 24 hours.  The glue will flake off over the  next 2-3 weeks.  Any sutures or staples will be removed at the office during your follow-up visit. °8. ACTIVITIES:  You may resume regular daily activities (gradually increasing) beginning the next day.  Wearing a good support bra or sports bra minimizes pain and swelling.  You may have sexual intercourse when it is comfortable. °a. You may drive when you no longer are taking prescription pain medication, you can comfortably wear a seatbelt, and you can safely maneuver your car and apply brakes. °b. RETURN TO WORK:  ______________________________________________________________________________________ °9. You should see your doctor in the office for a follow-up appointment approximately two weeks after your surgery.  Your doctor’s nurse will typically make your follow-up appointment when she calls you with your pathology report.  Expect your pathology report 2-3 business days after your surgery.  You may call to check if you do not hear from us after three days. °10. OTHER INSTRUCTIONS: _______________________________________________________________________________________________ _____________________________________________________________________________________________________________________________________ °_____________________________________________________________________________________________________________________________________ °_____________________________________________________________________________________________________________________________________ ° °WHEN TO CALL YOUR DOCTOR: °1. Fever over 101.0 °2. Nausea and/or vomiting. °3. Extreme swelling or bruising. °4. Continued bleeding from incision. °5. Increased pain, redness, or drainage from the incision. ° °The clinic staff is available to answer your questions during regular business hours.  Please don’t hesitate to call and ask to speak to one of the nurses for clinical concerns.  If you have a medical emergency, go to the nearest  emergency room or call 911.  A surgeon from [facility] is always on call at the hospital. ° °For further questions, please visit [email or web address]  ° ° ° °  Post Anesthesia Home Care Instructions ° °Activity: °Get plenty of rest for the remainder of the day. A responsible adult should stay with you for 24 hours following the procedure.  °For the next 24 hours, DO NOT: °-Drive a car °-Operate machinery °-Drink alcoholic beverages °-Take any medication unless instructed by your physician °-Make any legal decisions or sign important papers. ° °Meals: °Start with liquid foods such as gelatin or soup. Progress to regular foods as tolerated. Avoid greasy, spicy, heavy foods. If nausea and/or vomiting occur, drink only clear liquids until the nausea and/or vomiting subsides. Call your physician if vomiting continues. ° °Special Instructions/Symptoms: °Your throat may feel dry or sore from the anesthesia or the breathing tube placed in your throat during surgery. If this causes discomfort, gargle with warm salt water. The discomfort should disappear within 24 hours. ° °If you had a scopolamine patch placed behind your ear for the management of post- operative nausea and/or vomiting: ° °1. The medication in the patch is effective for 72 hours, after which it should be removed.  Wrap patch in a tissue and discard in the trash. Wash hands thoroughly with soap and water. °2. You may remove the patch earlier than 72 hours if you experience unpleasant side effects which may include dry mouth, dizziness or visual disturbances. °3. Avoid touching the patch. Wash your hands with soap and water after contact with the patch. °  ° °

## 2015-02-20 NOTE — Anesthesia Procedure Notes (Signed)
Procedure Name: LMA Insertion Performed by: Terrance Mass Pre-anesthesia Checklist: Patient identified, Timeout performed, Emergency Drugs available, Suction available and Patient being monitored Patient Re-evaluated:Patient Re-evaluated prior to inductionOxygen Delivery Method: Circle system utilized Preoxygenation: Pre-oxygenation with 100% oxygen Intubation Type: IV induction Ventilation: Mask ventilation without difficulty LMA Size: 4.0 Tube type: Oral Number of attempts: 1 Placement Confirmation: positive ETCO2 Tube secured with: Tape Dental Injury: Teeth and Oropharynx as per pre-operative assessment

## 2015-02-20 NOTE — Anesthesia Preprocedure Evaluation (Signed)
Anesthesia Evaluation  Patient identified by MRN, date of birth, ID band Patient awake    Reviewed: Allergy & Precautions, H&P , NPO status , Patient's Chart, lab work & pertinent test results, reviewed documented beta blocker date and time   History of Anesthesia Complications (+) history of anesthetic complications ("hyped up" after surgery in past (insomnia))  Airway Mallampati: II  TM Distance: >3 FB Neck ROM: full    Dental  (+) Teeth Intact   Pulmonary asthma (with allergies - last inhaler use 3 weeks ago) ,  Allergic rhinitis breath sounds clear to auscultation  Pulmonary exam normal       Cardiovascular negative cardio ROS  Rhythm:regular Rate:Normal     Neuro/Psych Anxiety negative neurological ROS  negative psych ROS   GI/Hepatic negative GI ROS, Neg liver ROS,   Endo/Other  negative endocrine ROS  Renal/GU negative Renal ROS     Musculoskeletal   Abdominal   Peds  Hematology negative hematology ROS (+)   Anesthesia Other Findings   Reproductive/Obstetrics negative OB ROS                             Anesthesia Physical  Anesthesia Plan  ASA: II  Anesthesia Plan: General   Post-op Pain Management:    Induction: Intravenous  Airway Management Planned: LMA  Additional Equipment:   Intra-op Plan:   Post-operative Plan: Extubation in OR  Informed Consent: I have reviewed the patients History and Physical, chart, labs and discussed the procedure including the risks, benefits and alternatives for the proposed anesthesia with the patient or authorized representative who has indicated his/her understanding and acceptance.   Dental advisory given  Plan Discussed with: CRNA  Anesthesia Plan Comments:         Anesthesia Quick Evaluation

## 2015-02-20 NOTE — Interval H&P Note (Signed)
History and Physical Interval Note:  02/20/2015 9:53 AM  Joan Cobb  has presented today for surgery, with the diagnosis of right breast mass  The various methods of treatment have been discussed with the patient and family. After consideration of risks, benefits and other options for treatment, the patient has consented to  Procedure(s): RIGHT BREAST LUMPECTOMY WITH RADIOACTIVE SEED LOCALIZATION (Right) as a surgical intervention .  The patient's history has been reviewed, patient examined, no change in status, stable for surgery.  I have reviewed the patient's chart and labs.  Questions were answered to the patient's satisfaction.     Adin Hector

## 2015-02-20 NOTE — Anesthesia Postprocedure Evaluation (Signed)
Anesthesia Post Note  Patient: Joan Cobb  Procedure(s) Performed: Procedure(s) (LRB): RIGHT BREAST LUMPECTOMY WITH RADIOACTIVE SEED LOCALIZATION (Right)  Anesthesia type: General  Patient location: PACU  Post pain: Pain level controlled  Post assessment: Post-op Vital signs reviewed  Last Vitals: BP 131/58 mmHg  Pulse 57  Temp(Src) 36.6 C (Oral)  Resp 20  Ht 5\' 5"  (1.651 m)  Wt 154 lb (69.854 kg)  BMI 25.63 kg/m2  SpO2 100%  LMP 11/10/2013  Post vital signs: Reviewed  Level of consciousness: sedated  Complications: No apparent anesthesia complications

## 2015-02-20 NOTE — Op Note (Signed)
Patient Name:           Joan Cobb   Date of Surgery:        02/20/2015  Pre op Diagnosis:      Abnormal mammogram right breast, upper inner quadrant  Post op Diagnosis:    Same  Procedure:                 Right breast lumpectomy with radioactive seed localization  Surgeon:                     Edsel Petrin. Dalbert Batman, M.D., FACS  Assistant:                      Or staff  Operative Indications: . This is a pleasant 54 year old Caucasian female, referred by Dr. Pamelia Hoit at the [facility] for evaluation and management of an abnormal mammogram of the right breast, upper inner quadrant. Dr. Aloha Gell is her gynecologist. Dr. Rachell Cipro is her PCP. The patient felt a lump in the upper outer quadrant of her right breast. Bilateral mammograms and right breast ultrasound were performed. Benign cystic process was noted in the upper outer quadrant. However, there was a focal area of architectural distortion in the right breast, upper inner quadrant, posterior depth. Image guided biopsy shows fibrocystic changes, sclerosing adenosis, but cannot rule out complex sclerosing lesion or radial scar. Surgical excision was recommended. Calcifications were noted. Examination reveals a needle biopsy site superiorly.  Minimal thickening of the biopsy site.  Both breasts are bilaterally diffusely lumpy but no dominant mass or skin change. I told her there was a low but definite chance she could have early breast cancer. I agreed with recommendation for a conservative excision.  She agreed and wanted to go ahead with the biopsy.  Operative Findings:       The patient underwent radioactive seed localization at the breast center of [facility] recently.  In the holding area I can hear the audible signal of the I-125 seed in the right breast, upper inner quadrant.  The specimen mammogram looked good, showing the radioactive seed and the biopsy clip in the center of the specimen.  Procedure in Detail:           Following the induction of general endotracheal anesthesia the patient's right breast was prepped and draped in a sterile fashion.  Surgical timeout was performed.  Intravenous antibiotic given.  0.5% Marcaine with epinephrine was used as local infiltration of septic.     Using the neoprobe as a guide I planned a curvilinear transverse incision in the superior right breast.  The incision was made.  Dissection was carried down into the breast tissue.  The dissection was carried all the way to the pectoralis fascia because the marker clip and the seed were in the posterior third.  The specimen was removed and marked with silk sutures and the ink kit to orient the pathologist.  The specimen mammogram looked good as described above.  The wound was irrigated with saline.  Hemostasis was excellent and achieved with electrocautery.  The breast tissues were closed in 2 layers, a deep layer and a superficial layer with interrupted 3-0 Vicryls and the skin closed with a running subcuticular suture of  4-0 Monocryl and Dermabond.  Breast binder was placed and patient taken to PACU in stable condition.  EBL 10 mL.  Counts correct.  Complications none.     Edsel Petrin. Dalbert Batman, M.D., [facility] Breast and Colorectal Surgery  02/20/2015 11:05 AM

## 2015-02-21 ENCOUNTER — Encounter (HOSPITAL_BASED_OUTPATIENT_CLINIC_OR_DEPARTMENT_OTHER): Payer: Self-pay | Admitting: General Surgery

## 2015-09-30 ENCOUNTER — Other Ambulatory Visit: Payer: Self-pay | Admitting: Family Medicine

## 2015-09-30 DIAGNOSIS — E2839 Other primary ovarian failure: Secondary | ICD-10-CM

## 2015-10-02 ENCOUNTER — Ambulatory Visit
Admission: RE | Admit: 2015-10-02 | Discharge: 2015-10-02 | Disposition: A | Discharge status: Home / Self Care | Payer: 59 | Source: Ambulatory Visit | Attending: Family Medicine | Admitting: Family Medicine

## 2015-10-02 DIAGNOSIS — E2839 Other primary ovarian failure: Secondary | ICD-10-CM

## 2016-02-23 IMAGING — US US BREAST LTD UNI RIGHT INC AXILLA
1 series · 7 of 7 positions shown · non-contrast
Comparison: 06/27/2014, 01/24/2014, 10/26/2013, 06/14/2013,
additional prior studies dating back to 04/27/2008

CLINICAL DATA: 53-year-old female with nonfocal lateral right
breast pain for at least 1 month. The patient reports prior cyst
aspirations and core biopsy of the right breast. She denies prior
surgical biopsy of the right breast. Per the physician's order, a
right breast mass is felt at 10 o'clock, 5 cm from the nipple.

EXAM:
DIGITAL DIAGNOSTIC RIGHT MAMMOGRAM WITH 3D TOMOSYNTHESIS WITH CAD
ULTRASOUND RIGHT BREAST

[Series 1: us breast ltd uni right inc axilla · 0.06mm/px · 7 of 7 slices shown]
[im 1/7]
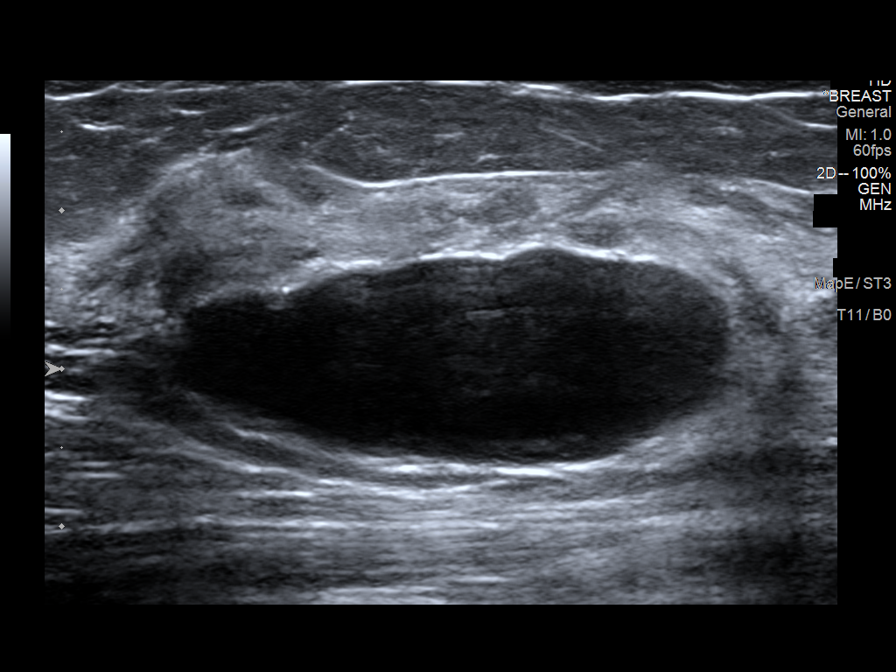
[im 2/7]
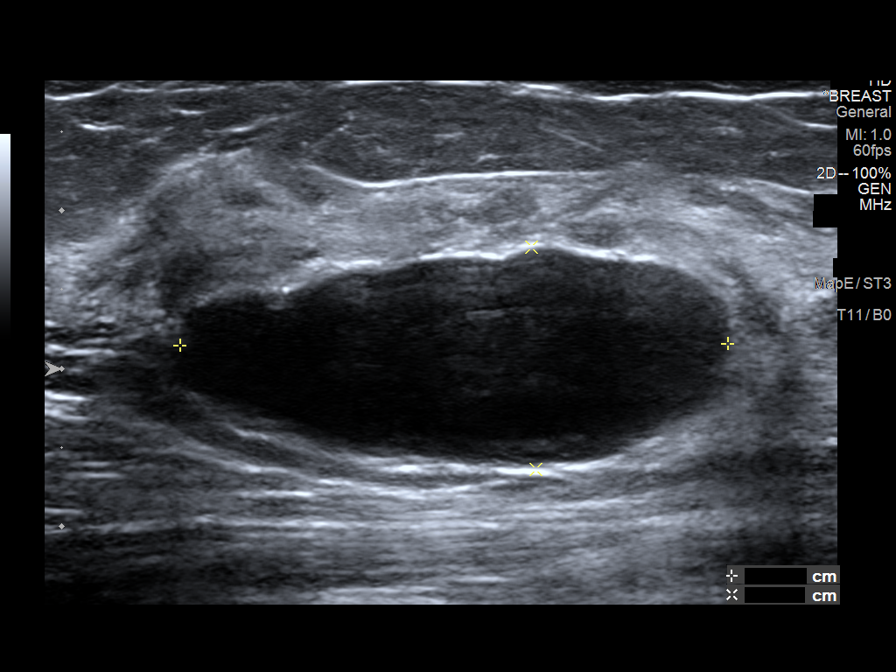
[im 3/7]
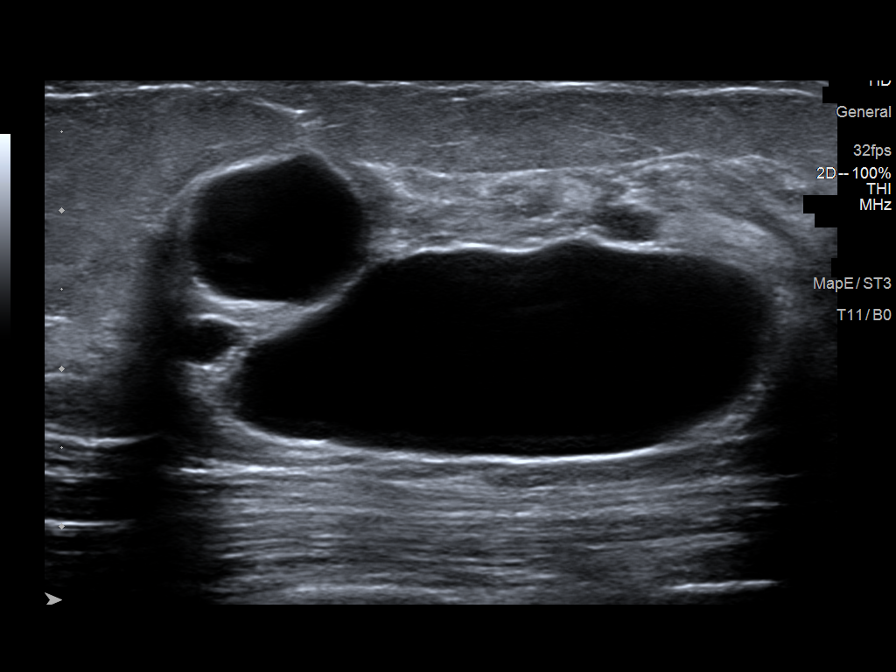
[im 4/7]
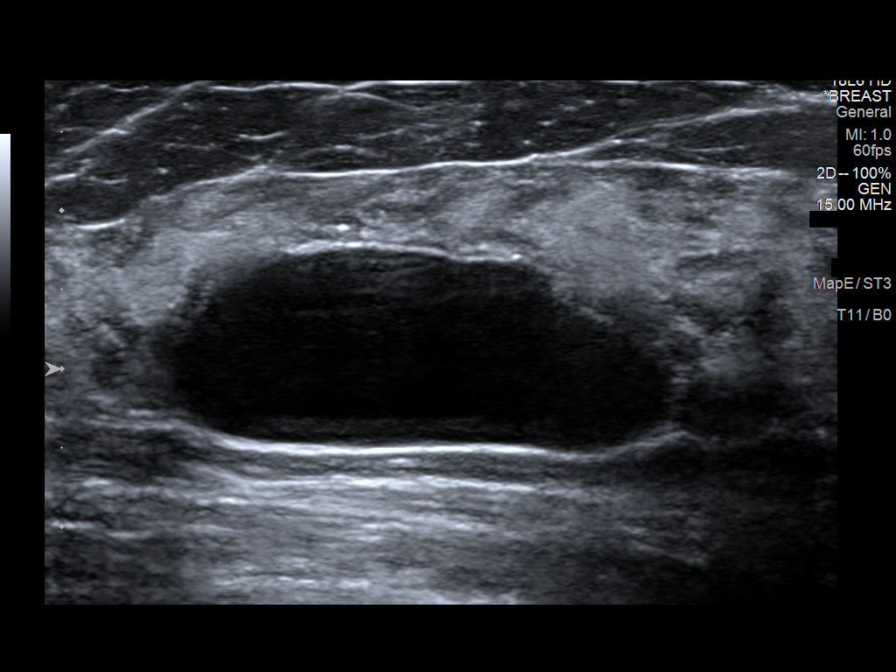
[im 5/7]
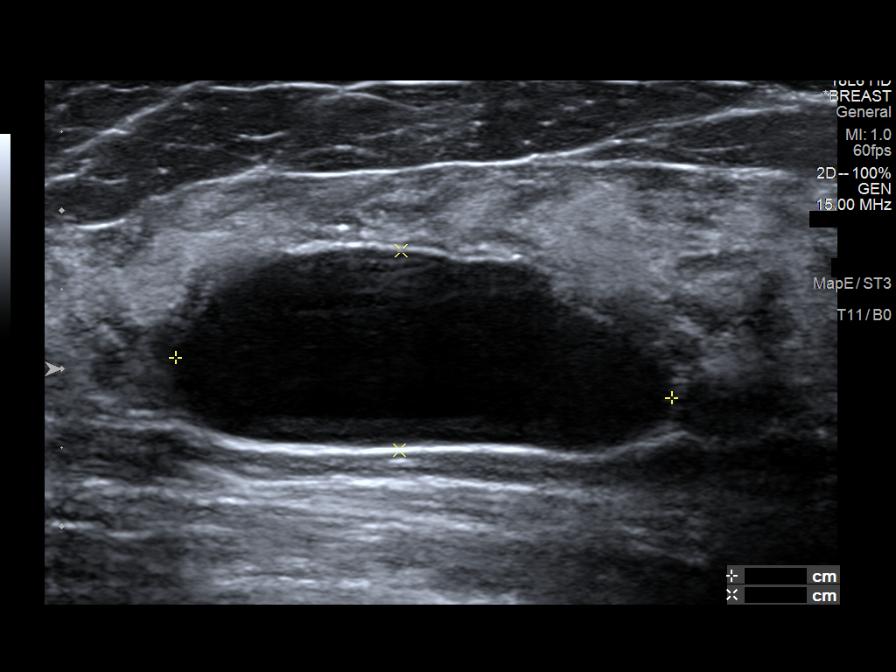
[im 6/7]
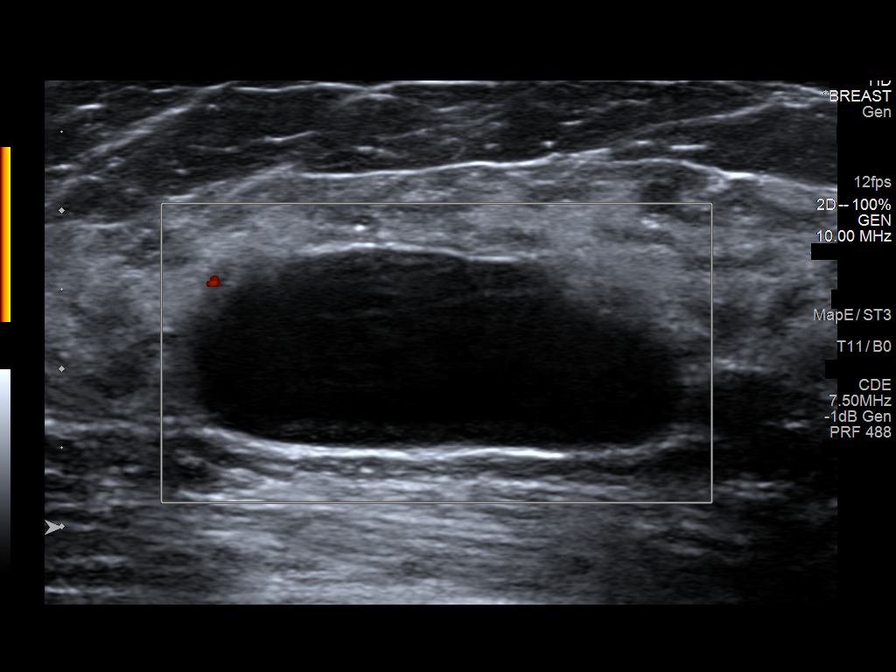
[im 7/7]
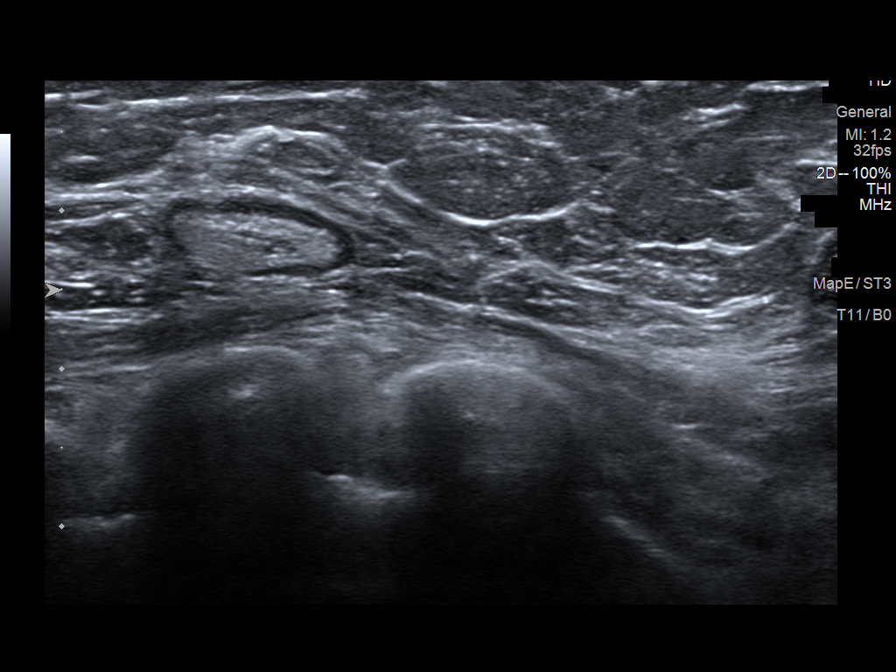

[7 of 7 positions shown; findings below may reference images not displayed]

ACR Breast Density Category c: The breast tissue is heterogeneously
dense, which may obscure small masses.
FINDINGS: There is a distortion within the upper, inner right breast,
posterior depth. Numerous oval, circumscribed masses are again noted
identified within the upper, outer right breast.

Mammographic images were processed with CAD.

On physical exam, there is a mobile approximately 3.5 cm oval mass
within the upper, outer right breast at 10 o'clock, 5 cm from the
nipple. No discrete mass is identified within the area of concern
within the upper, inner right breast.

Targeted ultrasound is performed, showing numerous cysts within the
upper, outer right breast in the area of patient's pain, the largest
measuring 3.5 x 1.4 x 3.2 cm, corresponding to the palpable
abnormality. No suspicious sonographic finding is identified in the
area of concern within the lateral right breast. Targeted ultrasound
of the upper, inner right breast demonstrates no definite
sonographic correlate for the architectural distortion seen on
mammogram. No abnormal appearing axillary lymph nodes are
identified.
IMPRESSION: 1. Suspicious right breast distortion.
2. Right breast cysts.

RECOMMENDATION:
Stereotactic/tomosynthesis guided right breast biopsy.

I have discussed the findings and recommendations with the patient.
Results were also provided in writing at the conclusion of the
visit. If applicable, a reminder letter will be sent to the patient
regarding the next appointment.

BI-RADS CATEGORY  4: Suspicious.

## 2016-03-27 ENCOUNTER — Other Ambulatory Visit: Payer: Self-pay | Admitting: Obstetrics

## 2016-03-27 DIAGNOSIS — Z1231 Encounter for screening mammogram for malignant neoplasm of breast: Secondary | ICD-10-CM

## 2016-04-29 ENCOUNTER — Ambulatory Visit: Payer: 59

## 2016-05-07 ENCOUNTER — Ambulatory Visit: Payer: 59

## 2016-05-19 ENCOUNTER — Ambulatory Visit: Payer: 59

## 2016-05-22 ENCOUNTER — Ambulatory Visit
Admission: RE | Admit: 2016-05-22 | Discharge: 2016-05-22 | Disposition: A | Discharge status: Home / Self Care | Payer: 59 | Source: Ambulatory Visit | Attending: Obstetrics | Admitting: Obstetrics

## 2016-05-22 DIAGNOSIS — Z1231 Encounter for screening mammogram for malignant neoplasm of breast: Secondary | ICD-10-CM

## 2016-08-20 ENCOUNTER — Encounter: Payer: Self-pay | Admitting: Gastroenterology

## 2016-09-14 ENCOUNTER — Encounter: Payer: Self-pay | Admitting: Gastroenterology

## 2016-10-05 ENCOUNTER — Other Ambulatory Visit: Payer: Self-pay | Admitting: Family Medicine

## 2016-10-05 ENCOUNTER — Ambulatory Visit
Admission: RE | Admit: 2016-10-05 | Discharge: 2016-10-05 | Disposition: A | Discharge status: Home / Self Care | Payer: 59 | Source: Ambulatory Visit | Attending: Family Medicine | Admitting: Family Medicine

## 2016-10-05 DIAGNOSIS — R52 Pain, unspecified: Secondary | ICD-10-CM

## 2016-10-05 DIAGNOSIS — R609 Edema, unspecified: Secondary | ICD-10-CM

## 2016-10-05 DIAGNOSIS — N63 Unspecified lump in unspecified breast: Secondary | ICD-10-CM

## 2016-10-07 ENCOUNTER — Other Ambulatory Visit: Payer: Self-pay | Admitting: Family Medicine

## 2016-10-07 DIAGNOSIS — R52 Pain, unspecified: Secondary | ICD-10-CM

## 2016-10-09 ENCOUNTER — Other Ambulatory Visit: Payer: 59

## 2016-10-13 ENCOUNTER — Other Ambulatory Visit: Payer: Self-pay | Admitting: Family Medicine

## 2016-10-13 ENCOUNTER — Ambulatory Visit
Admission: RE | Admit: 2016-10-13 | Discharge: 2016-10-13 | Disposition: A | Discharge status: Home / Self Care | Payer: 59 | Source: Ambulatory Visit | Attending: Family Medicine | Admitting: Family Medicine

## 2016-10-13 ENCOUNTER — Other Ambulatory Visit: Payer: 59

## 2016-10-13 DIAGNOSIS — N63 Unspecified lump in unspecified breast: Secondary | ICD-10-CM

## 2016-10-13 HISTORY — DX: Unspecified lump in unspecified breast: N63.0

## 2016-10-19 ENCOUNTER — Other Ambulatory Visit: Payer: 59

## 2016-10-24 ENCOUNTER — Ambulatory Visit
Admission: RE | Admit: 2016-10-24 | Discharge: 2016-10-24 | Disposition: A | Discharge status: Home / Self Care | Payer: 59 | Source: Ambulatory Visit | Attending: Family Medicine | Admitting: Family Medicine

## 2016-10-24 DIAGNOSIS — R52 Pain, unspecified: Secondary | ICD-10-CM

## 2016-11-20 ENCOUNTER — Ambulatory Visit (AMBULATORY_SURGERY_CENTER): Payer: Self-pay

## 2016-11-20 VITALS — Ht 65.0 in | Wt 161.6 lb

## 2016-11-20 DIAGNOSIS — Z860100 Personal history of colon polyps, unspecified: Secondary | ICD-10-CM

## 2016-11-20 DIAGNOSIS — K2940 Chronic atrophic gastritis without bleeding: Secondary | ICD-10-CM

## 2016-11-20 MED ORDER — SUPREP BOWEL PREP KIT 17.5-3.13-1.6 GM/177ML PO SOLN: 1.0000 | Freq: Once | ORAL | 0 refills | Status: AC

## 2016-11-20 NOTE — Progress Notes (Signed)
No allergies to eggs or soy No diet meds No home oxygen No past problems with anesthesia except feels cons sedation was a traumatic experience with wisdom teeth and breast biopsy  Registered for emmi

## 2016-11-23 ENCOUNTER — Encounter: Payer: Self-pay | Admitting: Gastroenterology

## 2016-12-04 ENCOUNTER — Encounter: Payer: Self-pay | Admitting: Gastroenterology

## 2016-12-04 ENCOUNTER — Ambulatory Visit (AMBULATORY_SURGERY_CENTER): Payer: 59 | Admitting: Gastroenterology

## 2016-12-04 VITALS — BP 110/50 | HR 62 | Temp 98.9°F | Resp 20 | Ht 65.0 in | Wt 161.0 lb

## 2016-12-04 DIAGNOSIS — K2950 Unspecified chronic gastritis without bleeding: Secondary | ICD-10-CM | POA: Diagnosis not present

## 2016-12-04 DIAGNOSIS — D124 Benign neoplasm of descending colon: Secondary | ICD-10-CM

## 2016-12-04 DIAGNOSIS — K635 Polyp of colon: Secondary | ICD-10-CM | POA: Diagnosis not present

## 2016-12-04 DIAGNOSIS — K3189 Other diseases of stomach and duodenum: Secondary | ICD-10-CM | POA: Diagnosis not present

## 2016-12-04 DIAGNOSIS — D126 Benign neoplasm of colon, unspecified: Secondary | ICD-10-CM | POA: Diagnosis not present

## 2016-12-04 DIAGNOSIS — K2940 Chronic atrophic gastritis without bleeding: Secondary | ICD-10-CM

## 2016-12-04 DIAGNOSIS — D123 Benign neoplasm of transverse colon: Secondary | ICD-10-CM

## 2016-12-04 DIAGNOSIS — Z86010 Personal history of colonic polyps: Secondary | ICD-10-CM

## 2016-12-04 MED ORDER — SODIUM CHLORIDE 0.9 % IV SOLN: 500.0000 mL | INTRAVENOUS | Status: DC

## 2016-12-04 NOTE — Progress Notes (Signed)
No changes in medical or surgical hx since PV 

## 2016-12-04 NOTE — Progress Notes (Signed)
Pt c/o "getting sunburnt on left arm" and having a slight rash to left arm.

## 2016-12-04 NOTE — Progress Notes (Signed)
Report to PACU, RN, vss, BBS= Clear.  

## 2016-12-04 NOTE — Op Note (Signed)
[place] Patient Name: Joan Cobb Procedure Date: 12/04/2016 1:09 PM MRN: [id number] Endoscopist: Ladene Artist , MD Age: 56 Referring MD:  Date of Birth: 06/11/1961 Gender: Female Account #: [id number] Procedure:                Colonoscopy Indications:              Surveillance: Personal history of adenomatous                            polyps on last colonoscopy 5 years ago Medicines:                Monitored Anesthesia Care Procedure:                Pre-Anesthesia Assessment:                           - Prior to the procedure, a History and Physical                            was performed, and patient medications and                            allergies were reviewed. The patient's tolerance of                            previous anesthesia was also reviewed. The risks                            and benefits of the procedure and the sedation                            options and risks were discussed with the patient.                            All questions were answered, and informed consent                            was obtained. Prior Anticoagulants: The patient has                            taken no previous anticoagulant or antiplatelet                            agents. ASA Grade Assessment: II - A patient with                            mild systemic disease. After reviewing the risks                            and benefits, the patient was deemed in                            satisfactory condition to undergo the procedure.  After obtaining informed consent, the colonoscope                            was passed under direct vision. Throughout the                            procedure, the patient's blood pressure, pulse, and                            oxygen saturations were monitored continuously. The                            Model PCF-[id number]) scope was introduced                            through the anus and  advanced to the the cecum,                            identified by appendiceal orifice and ileocecal                            valve. The ileocecal valve, appendiceal orifice,                            and rectum were photographed. The quality of the                            bowel preparation was good. The colonoscopy was                            performed without difficulty. The patient tolerated                            the procedure well. Scope In: 1:38:00 PM Scope Out: 1:52:45 PM Scope Withdrawal Time: 0 hours 11 minutes 22 seconds  Total Procedure Duration: 0 hours 14 minutes 45 seconds  Findings:                 The perianal and digital rectal examinations were                            normal.                           Two sessile polyps were found in the descending                            colon and transverse colon. The polyps were 6 mm in                            size. These polyps were removed with a cold snare.                            Resection and retrieval were complete.  A few small-mouthed diverticula were found in the                            sigmoid colon.                           The exam was otherwise without abnormality on                            direct and retroflexion views. Complications:            No immediate complications. Estimated blood loss:                            None. Estimated Blood Loss:     Estimated blood loss: none. Impression:               - Two 6 mm polyps in the descending colon and in                            the transverse colon, removed with a cold snare.                            Resected and retrieved.                           - Diverticulosis in the sigmoid colon.                           - The examination was otherwise normal on direct                            and retroflexion views. Recommendation:           - Repeat colonoscopy in 5 years for surveillance.                            - Patient has a contact number available for                            emergencies. The signs and symptoms of potential                            delayed complications were discussed with the                            patient. Return to normal activities tomorrow.                            Written discharge instructions were provided to the                            patient.                           - Resume previous diet.                           -  Continue present medications.                           - Await pathology results. Ladene Artist, MD 12/04/2016 1:55:15 PM This report has been signed electronically.

## 2016-12-04 NOTE — Progress Notes (Signed)
Called to room to assist during endoscopic procedure.  Patient ID and intended procedure confirmed with present staff. Received instructions for my participation in the procedure from the performing physician.  

## 2016-12-04 NOTE — Patient Instructions (Signed)
Impression/Recommendations:  Gastritis handout given to patient. Hiatal hernia handout given to patient. Polyp handout given to patient. Diverticulosis handout given to patient.  Await pathology results.  Repeat colonoscopy in 5 years for surveillance. Repeat upper endoscopy in 2-3 years for surveillance.YOU HAD AN ENDOSCOPIC PROCEDURE TODAY AT [facility]:   Refer to the procedure report that was given to you for any specific questions about what was found during the examination.  If the procedure report does not answer your questions, please call your gastroenterologist to clarify.  If you requested that your care partner not be given the details of your procedure findings, then the procedure report has been included in a sealed envelope for you to review at your convenience later.  YOU SHOULD EXPECT: Some feelings of bloating in the abdomen. Passage of more gas than usual.  Walking can help get rid of the air that was put into your GI tract during the procedure and reduce the bloating. If you had a lower endoscopy (such as a colonoscopy or flexible sigmoidoscopy) you may notice spotting of blood in your stool or on the toilet paper. If you underwent a bowel prep for your procedure, you may not have a normal bowel movement for a few days.  Please Note:  You might notice some irritation and congestion in your nose or some drainage.  This is from the oxygen used during your procedure.  There is no need for concern and it should clear up in a day or so.  SYMPTOMS TO REPORT IMMEDIATELY:   Following lower endoscopy (colonoscopy or flexible sigmoidoscopy):  Excessive amounts of blood in the stool  Significant tenderness or worsening of abdominal pains  Swelling of the abdomen that is new, acute  Fever of 100F or higher   Following upper endoscopy (EGD)  Vomiting of blood or coffee ground material  New chest pain or pain under the shoulder blades  Painful or persistently  difficult swallowing  New shortness of breath  Fever of 100F or higher  Black, tarry-looking stools  For urgent or emergent issues, a gastroenterologist can be reached at any hour by calling [phone].   DIET:  We do recommend a small meal at first, but then you may proceed to your regular diet.  Drink plenty of fluids but you should avoid alcoholic beverages for 24 hours.  ACTIVITY:  You should plan to take it easy for the rest of today and you should NOT DRIVE or use heavy machinery until tomorrow (because of the sedation medicines used during the test).    FOLLOW UP: Our staff will call the number listed on your records the next business day following your procedure to check on you and address any questions or concerns that you may have regarding the information given to you following your procedure. If we do not reach you, we will leave a message.  However, if you are feeling well and you are not experiencing any problems, there is no need to return our call.  We will assume that you have returned to your regular daily activities without incident.  If any biopsies were taken you will be contacted by phone or by letter within the next 1-3 weeks.  Please call us at [phone] if you have not heard about the biopsies in 3 weeks.    SIGNATURES/CONFIDENTIALITY: You and/or your care partner have signed paperwork which will be entered into your electronic medical record.  These signatures attest to the fact that that  the information above on your After Visit Summary has been reviewed and is understood.  Full responsibility of the confidentiality of this discharge information lies with you and/or your care-partner.  Resume previous diet. Continue present medications.

## 2016-12-04 NOTE — Op Note (Addendum)
[place] Patient Name: Joan Cobb Procedure Date: 12/04/2016 1:09 PM MRN: [id number] Endoscopist: Ladene Artist , MD Age: 56 Referring MD:  Date of Birth: 1961/06/18 Gender: Female Account #: [id number] Procedure:                Upper GI endoscopy Indications:              Surveillance for malignancy due to personal history                            of premalignant condition, Follow-up of atrophic                            gastritis Medicines:                Monitored Anesthesia Care Procedure:                Pre-Anesthesia Assessment:                           - Prior to the procedure, a History and Physical                            was performed, and patient medications and                            allergies were reviewed. The patient's tolerance of                            previous anesthesia was also reviewed. The risks                            and benefits of the procedure and the sedation                            options and risks were discussed with the patient.                            All questions were answered, and informed consent                            was obtained. Prior Anticoagulants: The patient has                            taken no previous anticoagulant or antiplatelet                            agents. ASA Grade Assessment: II - A patient with                            mild systemic disease. After reviewing the risks                            and benefits, the patient was deemed in  satisfactory condition to undergo the procedure.                           After obtaining informed consent, the endoscope was                            passed under direct vision. Throughout the                            procedure, the patient's blood pressure, pulse, and                            oxygen saturations were monitored continuously. The                            Endoscope was introduced through the mouth,  and                            advanced to the second part of duodenum. The upper                            GI endoscopy was accomplished without difficulty.                            The patient tolerated the procedure well. Scope In: Scope Out: Findings:                 The examined esophagus was normal.                           Diffuse mild inflammation characterized by erythema                            was found in the cardia, in the gastric fundus and                            in the gastric body. Biopsies were taken with a                            cold forceps for histology.                           Three 3 to 4 mm mucosal papules (nodules) with no                            bleeding and no stigmata of recent bleeding were                            found in the gastric antrum. Biopsies were taken                            with a cold forceps for histology.  Five 4 to 5 mm mucosal papules (nodules) with no                            bleeding and no stigmata of recent bleeding were                            found in the gastric body. Biopsies were taken with                            a cold forceps for histology.                           Two 3 to 4 mm mucosal papules (nodules) with no                            bleeding and no stigmata of recent bleeding were                            found in the gastric fundus. Biopsies were taken                            with a cold forceps for histology.                           A small hiatal hernia was present.                           The exam of the stomach was otherwise normal.                           The duodenal bulb and second portion of the                            duodenum were normal. Complications:            No immediate complications. Estimated Blood Loss:     Estimated blood loss was minimal. Impression:               - Normal esophagus.                           - Atrophic  gastritis. Biopsied.                           - Three mucosal papules (nodules) found in the                            stomach. Biopsied.                           - Five mucosal papules (nodules) found in the                            stomach. Biopsied.                           -  Two mucosal papules (nodules) found in the                            stomach. Biopsied.                           - Small hiatal hernia.                           - Normal duodenal bulb and second portion of the                            duodenum. Recommendation:           - Patient has a contact number available for                            emergencies. The signs and symptoms of potential                            delayed complications were discussed with the                            patient. Return to normal activities tomorrow.                            Written discharge instructions were provided to the                            patient.                           - Resume previous diet.                           - Continue present medications.                           - Await pathology results.                           - Repeat upper endoscopy in 2-3 years for                            surveillance pending pathology review. Ladene Artist, MD 12/04/2016 2:12:20 PM This report has been signed electronically.

## 2016-12-07 ENCOUNTER — Telehealth: Payer: Self-pay

## 2016-12-07 NOTE — Telephone Encounter (Signed)
  Follow up Call-  Call back number 12/04/2016 12/28/2014  Post procedure Call Back phone  # [phone]  Permission to leave phone message Yes Yes  Some recent data might be hidden     Patient questions:  Do you have a fever, pain , or abdominal swelling? No. Pain Score  0 *  Have you tolerated food without any problems? Yes.    Have you been able to return to your normal activities? Yes.    Do you have any questions about your discharge instructions: Diet   No. Medications  No. Follow up visit  No.  Do you have questions or concerns about your Care? No.  Actions: * If pain score is 4 or above: No action needed, pain <4.  No problems noted per pt. maw

## 2016-12-21 ENCOUNTER — Encounter: Payer: Self-pay | Admitting: Gastroenterology

## 2017-06-18 ENCOUNTER — Other Ambulatory Visit: Payer: Self-pay | Admitting: Obstetrics and Gynecology

## 2017-06-18 DIAGNOSIS — Z1231 Encounter for screening mammogram for malignant neoplasm of breast: Secondary | ICD-10-CM

## 2017-07-20 ENCOUNTER — Ambulatory Visit
Admission: RE | Admit: 2017-07-20 | Discharge: 2017-07-20 | Disposition: A | Discharge status: Home / Self Care | Payer: 59 | Source: Ambulatory Visit | Attending: Obstetrics and Gynecology | Admitting: Obstetrics and Gynecology

## 2017-07-20 DIAGNOSIS — Z1231 Encounter for screening mammogram for malignant neoplasm of breast: Secondary | ICD-10-CM

## 2017-12-23 ENCOUNTER — Other Ambulatory Visit: Payer: Self-pay | Admitting: Family Medicine

## 2017-12-23 DIAGNOSIS — N6002 Solitary cyst of left breast: Secondary | ICD-10-CM

## 2017-12-28 ENCOUNTER — Other Ambulatory Visit: Payer: 59

## 2018-01-06 ENCOUNTER — Ambulatory Visit
Admission: RE | Admit: 2018-01-06 | Discharge: 2018-01-06 | Disposition: A | Discharge status: Home / Self Care | Payer: 59 | Source: Ambulatory Visit | Attending: Family Medicine | Admitting: Family Medicine

## 2018-01-06 DIAGNOSIS — N6002 Solitary cyst of left breast: Secondary | ICD-10-CM

## 2018-03-21 ENCOUNTER — Encounter (HOSPITAL_COMMUNITY): Payer: Self-pay

## 2018-03-21 ENCOUNTER — Inpatient Hospital Stay (HOSPITAL_COMMUNITY)
Admission: AD | Admit: 2018-03-21 | Discharge: 2018-03-21 | Disposition: A | Discharge status: Home / Self Care | Payer: 59 | Source: Ambulatory Visit | Attending: Obstetrics and Gynecology | Admitting: Obstetrics and Gynecology

## 2018-03-21 DIAGNOSIS — Z79899 Other long term (current) drug therapy: Secondary | ICD-10-CM | POA: Insufficient documentation

## 2018-03-21 DIAGNOSIS — Z90710 Acquired absence of both cervix and uterus: Secondary | ICD-10-CM | POA: Insufficient documentation

## 2018-03-21 DIAGNOSIS — Z9889 Other specified postprocedural states: Secondary | ICD-10-CM | POA: Insufficient documentation

## 2018-03-21 DIAGNOSIS — Z885 Allergy status to narcotic agent status: Secondary | ICD-10-CM | POA: Diagnosis not present

## 2018-03-21 DIAGNOSIS — N9989 Other postprocedural complications and disorders of genitourinary system: Secondary | ICD-10-CM

## 2018-03-21 DIAGNOSIS — R338 Other retention of urine: Secondary | ICD-10-CM | POA: Diagnosis not present

## 2018-03-21 NOTE — Discharge Instructions (Signed)
Clean Intermittent Catheterization, Female Clean intermittent catheterization (CIC) is a procedure to remove urine from the bladder by placing a small, flexible tube (catheter) into the bladder though the urethra. The urethra is a tube in the body that carries urine from the bladder out of the body. CIC may be done when:  You cannot completely empty your bladder on your own. This may be due to a blockage in the bladder or urethra.  Your bladder leaks urine. This may happen when the muscles or nerves near the bladder are not working normally, so the bladder overflows.  Your health care provider will show you how to perform CIC and will help you to feel comfortable performing this procedure at home. Your health care provider will also help you to get the home care supplies that are needed for this procedure. What supplies will I need?  Germ-free (sterile), water-based lubricant.  A container for urine collection. You may also use the toilet to dispose of urine from the catheter.  A catheter. Your health care provider will determine the best size for you.  Sterile gloves.  Sterile gauze.  Medicated sterile swabs. How do I perform the procedure? Most people need CIC at least 4 times per dayto adequately empty the bladder. Your health care provider will tell you how often you should perform CIC. To perform CIC, follow these steps: 1. Wash your hands with soap and water. If soap and water are not available, use hand sanitizer. 2. Prepare the supplies that you will use during the procedure. Open the catheter pack, the lubricant, and the pack of medicated sterile swabs. If you have been told to keep the procedure sterile, do not touch your supplies until you are wearing gloves. 3. Get in a comfortable position. It may be helpful to use a handheld mirror to look at the opening of your urethra. Possible positions include: ? Sitting on a toilet, a chair, or the edge of a bed. ? Standing next to a  toilet with one foot on the toilet rim. ? Lying down with your head raised on pillows and your knees pointing to the ceiling. 4. If you are using a urine collection container, position it between your legs. 5. Urinate, if you are able. 6. Put on gloves. 7. Apply lubricant to about 2 inches (5 cm) of the tip of the catheter. 8. Set the catheter down on a clean, dry surface within reach. 9. Gently spread the folds of skin around your vagina (labia) with your non-dominant hand. For example, if you are right-handed, use your left hand to do this. With your other hand, clean the area around your urethra with medicated sterile swabs as told by your health care provider. 10. While keeping your labia spread apart, slowly insert the lubricated catheter 2-3 inches (5-8 cm) straight into your urethra until urine flows freely. Allow urine to drain into the toilet or the urine collection container. 11. When urine starts to flow freely, insert the catheter 1 inch (3 cm) more. 12. When urine stops flowing, slowly remove the catheter. 13. Note the color, amount, and odor of the urine. 14. Clean your labia using soap and water. 15. Wash your hands with soap and water. 16. Follow package instructions about how to clean the catheter after each use.  What should I do at home? How Often Should I Perform CIC?  Do CIC to empty your bladder every 4-6 hours or as often as told by your health care provider.  If  you have symptoms of too much urine in your bladder (overdistension) and you are not able to urinate, perform CIC. Symptoms of overdistension may include: ? Restlessness. ? Sweating or chills. ? Headache. ? Flushedor pale skin. ? Cold limbs. ? Bloated lower abdomen. What Are Some Steps That I Can Take to Avoid Problems?  Drink enough fluid to keep your urine clear or pale yellow.  Avoid caffeine. Caffeine may make you urinate more frequently and more urgently.  Dispose of a multiple-use catheter when  it becomes dry, brittle, or cloudy. This usually happens after you use the catheter for 1 week.  Take over-the-counter and prescription medicines only as told by your health care provider.  Keep all follow-up visits as told by your health care provider. This is important. Contact a health care provider if:  You have difficulty performing CIC.  You have urine leaking during CIC.  You have: ? Dark or cloudy urine. ? Blood in your urine or in your catheter. ? A change in the smell of your urine or discharge. ? A burning feeling while you urinate.  You feel nauseous or you vomit.  You have pain in your abdomen, your back, or your sides below your ribs.  You have swelling or redness around the opening of your urethra.  You develop a rash or sores on your skin. Get help right away if:  You have a fever.  You have symptoms that do not go away after 3 days.  You have symptoms that suddenly get worse.  You have severe pain.  The amount of urine that drains from your bladder decreases. This information is not intended to replace advice given to you by your health care provider. Make sure you discuss any questions you have with your health care provider. Document Released: 08/29/2010 Document Revised: 01/02/2016 Document Reviewed: 02/08/2015 Elsevier Interactive Patient Education  [employer].

## 2018-03-21 NOTE — MAU Provider Note (Signed)
History     CSN: [id number]  Arrival date and time: 03/21/18 2215   None     Chief Complaint  Patient presents with  . Post-op Problem   HPI Joan Cobb is a 57 y.o. post op patient who presents to MAU with chief complaint of being unable to self cath at home after cystocele/rectocele sling placement at [facility] earlier today. Patient states she was trained in office but was not able to self cath tonight around 8pm after she was discharged.  Past Medical History:  Diagnosis Date  . Adenomatous colon polyp 05/2007  . Allergic rhinitis   . Allergy   . Anxiety   . Asthma    with allergies  . Breast mass 10/13/2016  . Complication of anesthesia    states she is hyperactive post op with every past surgery  . Gastric polyps   . Iron deficiency anemia    prior to hysterectomy  . Retinal tear of left eye   . Vitamin B12 deficiency     Past Surgical History:  Procedure Laterality Date  . ABDOMINAL HYSTERECTOMY    . BILATERAL SALPINGECTOMY Bilateral 12/22/2013   Procedure: BILATERAL SALPINGECTOMY;  Surgeon: Claiborne Billings A. Pamala Hurry, MD;  Location: [facility];  Service: Gynecology;  Laterality: Bilateral;  . BREAST BIOPSY     left  . BREAST CYST ASPIRATION  march and june 2015  . BREAST EXCISIONAL BIOPSY Right 2016  . BREAST EXCISIONAL BIOPSY Left   . BREAST LUMPECTOMY WITH RADIOACTIVE SEED LOCALIZATION Right 02/20/2015   Procedure: RIGHT BREAST LUMPECTOMY WITH RADIOACTIVE SEED LOCALIZATION;  Surgeon: Fanny Skates, MD;  Location: [facility];  Service: General;  Laterality: Right;  . COLONOSCOPY    . CYSTOCELE REPAIR    . Left hand surgery    . POLYPECTOMY    . RECTOCELE REPAIR    . ROBOTIC ASSISTED TOTAL HYSTERECTOMY N/A 12/22/2013   Procedure: ROBOTIC ASSISTED TOTAL HYSTERECTOMY With Pelvic Washings;  Surgeon: Floyce Stakes. Pamala Hurry, MD;  Location: [facility];  Service: Gynecology;  Laterality: N/A;  . TOE SURGERY     left  . TONSILLECTOMY    . vaginal deliveries  1990, 1995   . WISDOM TOOTH EXTRACTION Bilateral     Family History  Problem Relation Age of Onset  . Heart disease Father   . Heart disease Sister   . Colon cancer Neg Hx   . Esophageal cancer Neg Hx   . Rectal cancer Neg Hx   . Stomach cancer Neg Hx     Social History   Tobacco Use  . Smoking status: Never Smoker  . Smokeless tobacco: Never Used  Substance Use Topics  . Alcohol use: Yes    Alcohol/week: 2.0 - 3.0 standard drinks    Types: 2 - 3 Glasses of wine per week    Comment: 1 glass of wine per day  . Drug use: No    Allergies:  Allergies  Allergen Reactions  . Other     Chlorhexidine gluconate 4%  . Percocet [Oxycodone-Acetaminophen] Other (See Comments)    Hyperactive    Facility-Administered Medications Prior to Admission  Medication Dose Route Frequency Provider Last Rate Last Dose  . 0.9 %  sodium chloride infusion  500 mL Intravenous Continuous Ladene Artist, MD       Medications Prior to Admission  Medication Sig Dispense Refill Last Dose  . albuterol (PROVENTIL HFA;VENTOLIN HFA) 108 (90 BASE) MCG/ACT inhaler Inhale 2 puffs into the lungs every 6 (six) hours  as needed for wheezing. With allergies   Unknown  . ALPRAZolam (XANAX) 0.5 MG tablet Take 0.5 mg by mouth at bedtime as needed for anxiety.   Unknown  . azelastine (ASTELIN) 0.1 % nasal spray Place 1 spray into both nostrils daily. Use in each nostril as directed   12/04/2016  . carboxymethylcellulose (REFRESH PLUS) 0.5 % SOLN 1 drop 3 (three) times daily as needed.   Unknown  . Cyanocobalamin (VITAMIN B-12 CR PO) Take by mouth daily.   Past Week  . diclofenac sodium (VOLTAREN) 1 % GEL Apply topically 4 (four) times daily.   Past Week  . fexofenadine (ALLEGRA) 180 MG tablet Take 180 mg by mouth daily.   12/03/2016  . FLUTICASONE PROPIONATE, NASAL, NA Place 2 sprays into the nose daily.   12/04/2016  . glucosamine-chondroitin 500-400 MG tablet Take 1 tablet by mouth daily.   Past Week  . ibuprofen  (ADVIL,MOTRIN) 600 MG tablet Take 1 tablet (600 mg total) by mouth every 6 (six) hours as needed (mild pain). 60 tablet 3 Unknown  . ketotifen (ZADITOR) 0.025 % ophthalmic solution 1 drop 2 (two) times daily.   Past Week  . Naproxen Sodium 220 MG CAPS Take 1 capsule by mouth 2 (two) times daily before a meal.   Unknown  . OLIVE LEAF PO Take by mouth.   Past Week  . OVER THE COUNTER MEDICATION    Past Week  . PRESCRIPTION MEDICATION    Past Week  . rosuvastatin (CRESTOR) 10 MG tablet Take 10 mg by mouth daily.   Past Week  . sulfamethoxazole-trimethoprim (BACTRIM DS,SEPTRA DS) 800-160 MG tablet Take 0.5 tablets by mouth as needed.   Past Week  . Vitamin D, Cholecalciferol, 1000 units TABS Take by mouth.   Past Week    Review of Systems  Constitutional: Negative for chills, fatigue and fever.  Genitourinary: Positive for vaginal bleeding. Negative for dyspareunia and dysuria.  Neurological: Negative for headaches.  All other systems reviewed and are negative.  Physical Exam   Blood pressure (!) 145/93, pulse 90, temperature 98.4 F (36.9 C), temperature source Oral, resp. rate 18, height 5\' 5"  (1.651 m), weight 68.9 kg, last menstrual period 11/10/2013, SpO2 97 %.  Physical Exam  Nursing note and vitals reviewed. Constitutional: She is oriented to person, place, and time. She appears well-developed and well-nourished.  Cardiovascular: Normal rate, regular rhythm, normal heart sounds and intact distal pulses.  Respiratory: Effort normal and breath sounds normal.  GI: Soft. Bowel sounds are normal.  Genitourinary: Vagina normal and uterus normal.  Musculoskeletal: Normal range of motion.  Neurological: She is alert and oriented to person, place, and time. She has normal reflexes.  Skin: Skin is warm and dry.  Psychiatric: She has a normal mood and affect. Her behavior is normal. Judgment and thought content normal.    MAU Course  Procedures  MDM Visibility for self catheterization  compromised by post-operative swelling  Patient Vitals for the past 24 hrs:  BP Temp Temp src Pulse Resp SpO2 Height Weight  03/21/18 2229 (!) 145/93 98.4 F (36.9 C) Oral 90 18 97 % 5\' 5"  (1.651 m) 68.9 kg    Orders Placed This Encounter  Procedures  . Bladder scan  . In and Out Cath     Assessment and Plan  --800 mL urine out via in and out cath --Patient to call attempt self catheterization overnight PRN and followup with GYN surgical team tomorrow morning --Discharge home in stable condition  Darlina Rumpf, CNM 03/21/2018, 11:34 PM

## 2018-03-21 NOTE — MAU Note (Signed)
Pt had a cystocele/rectocele sling placement at [facility] today. States she tried to cath herself. States she was unable to cath herself once she got home. States she was cathed at the hospital around 5:30-6:00pm. Has not gone to bathroom since. States she called on call physician and was told to come in

## 2018-07-13 ENCOUNTER — Other Ambulatory Visit: Payer: Self-pay | Admitting: Family Medicine

## 2018-07-13 DIAGNOSIS — Z1231 Encounter for screening mammogram for malignant neoplasm of breast: Secondary | ICD-10-CM

## 2018-07-29 ENCOUNTER — Ambulatory Visit
Admission: RE | Admit: 2018-07-29 | Discharge: 2018-07-29 | Disposition: A | Discharge status: Home / Self Care | Payer: 59 | Source: Ambulatory Visit | Attending: Family Medicine | Admitting: Family Medicine

## 2018-07-29 DIAGNOSIS — Z1231 Encounter for screening mammogram for malignant neoplasm of breast: Secondary | ICD-10-CM

## 2018-10-13 ENCOUNTER — Other Ambulatory Visit: Payer: Self-pay | Admitting: Allergy and Immunology

## 2018-10-13 ENCOUNTER — Ambulatory Visit
Admission: RE | Admit: 2018-10-13 | Discharge: 2018-10-13 | Disposition: A | Discharge status: Home / Self Care | Payer: 59 | Source: Ambulatory Visit | Attending: Allergy and Immunology | Admitting: Allergy and Immunology

## 2018-10-13 DIAGNOSIS — J4540 Moderate persistent asthma, uncomplicated: Secondary | ICD-10-CM

## 2018-12-16 ENCOUNTER — Encounter: Payer: Self-pay | Admitting: Gastroenterology

## 2018-12-26 ENCOUNTER — Ambulatory Visit: Payer: 59 | Admitting: *Deleted

## 2018-12-26 ENCOUNTER — Other Ambulatory Visit: Payer: Self-pay

## 2018-12-26 ENCOUNTER — Encounter: Payer: Self-pay | Admitting: Gastroenterology

## 2018-12-26 VITALS — Ht 65.0 in | Wt 145.0 lb

## 2018-12-26 DIAGNOSIS — K2940 Chronic atrophic gastritis without bleeding: Secondary | ICD-10-CM

## 2018-12-26 NOTE — Progress Notes (Addendum)
No egg or soy allergy known to patient  No issues with past sedation with any surgeries  or procedures, no intubation problems - hypersensitive post op - cant sleep post op - past hx nausea with bladder surgery  No diet pills per patient No home 02 use per patient  No blood thinners per patient  Pt denies issues with constipation  No A fib or A flutter   Pt e-mailed instruction packet instructions due to procedure Thursday and she has no my chart to get instructions .PV completed over the phone. Pt encouraged to call with questions or issues   Pt states mouth burning on and off- felt like tongue was swollen  thought thrush but meds didn't clear it up- thought allergic to a supplement- stopped all , not much help - is some better but not completely gone- pt is also complaining of dry mouth  - pt instructed to tell Dr Fuller Plan this at  procedure Thursday

## 2018-12-27 ENCOUNTER — Telehealth: Payer: Self-pay

## 2018-12-27 NOTE — Telephone Encounter (Signed)
Covid-19 travel screening questions  Have you traveled in the last 14 days? If yes where? No Do you now or have you had a fever in the last 14 days? No Do you have any respiratory symptoms of shortness of breath or cough now or in the last 14 days? No Do you have any family members or close contacts with diagnosed or suspected Covid-19? No      

## 2018-12-29 ENCOUNTER — Ambulatory Visit (AMBULATORY_SURGERY_CENTER): Payer: 59 | Admitting: Gastroenterology

## 2018-12-29 ENCOUNTER — Encounter: Payer: Self-pay | Admitting: Gastroenterology

## 2018-12-29 ENCOUNTER — Other Ambulatory Visit: Payer: Self-pay

## 2018-12-29 VITALS — BP 124/76 | HR 61 | Temp 98.9°F | Resp 14 | Ht 65.0 in | Wt 145.0 lb

## 2018-12-29 DIAGNOSIS — K2940 Chronic atrophic gastritis without bleeding: Secondary | ICD-10-CM

## 2018-12-29 DIAGNOSIS — K31A0 Gastric intestinal metaplasia, unspecified: Secondary | ICD-10-CM

## 2018-12-29 DIAGNOSIS — K2941 Chronic atrophic gastritis with bleeding: Secondary | ICD-10-CM

## 2018-12-29 DIAGNOSIS — K3189 Other diseases of stomach and duodenum: Secondary | ICD-10-CM

## 2018-12-29 DIAGNOSIS — K449 Diaphragmatic hernia without obstruction or gangrene: Secondary | ICD-10-CM | POA: Diagnosis not present

## 2018-12-29 MED ORDER — SODIUM CHLORIDE 0.9 % IV SOLN: 500.0000 mL | Freq: Once | INTRAVENOUS | Status: DC

## 2018-12-29 NOTE — Op Note (Addendum)
[facility] Patient Name: Joan Cobb Procedure Date: 12/29/2018 10:14 AM MRN: [id number] Endoscopist: Ladene Artist , MD Age: 58 Referring MD:  Date of Birth: 04-15-1961 Gender: Female Account #: [id number] Procedure:                Upper GI endoscopy Indications:              Surveillance procedure for atrophic gastritis,                            intestinal metaplasia. Medicines:                Monitored Anesthesia Care Procedure:                Pre-Anesthesia Assessment:                           - Prior to the procedure, a History and Physical                            was performed, and patient medications and                            allergies were reviewed. The patient's tolerance of                            previous anesthesia was also reviewed. The risks                            and benefits of the procedure and the sedation                            options and risks were discussed with the patient.                            All questions were answered, and informed consent                            was obtained. Prior Anticoagulants: The patient has                            taken no previous anticoagulant or antiplatelet                            agents. ASA Grade Assessment: II - A patient with                            mild systemic disease. After reviewing the risks                            and benefits, the patient was deemed in                            satisfactory condition to undergo the procedure.  After obtaining informed consent, the endoscope was                            passed under direct vision. Throughout the                            procedure, the patient's blood pressure, pulse, and                            oxygen saturations were monitored continuously. The                            Endoscope was introduced through the mouth, and                            advanced to the second part of  duodenum. The upper                            GI endoscopy was accomplished without difficulty                            except it was difficult to maintain ongoing gastric                            insufflation. The patient tolerated the procedure                            well. Scope In: Scope Out: Findings:                 The examined esophagus was normal.                           Seven 4 to 6 mm mucosal papules (nodules) with no                            bleeding and no stigmata of recent bleeding were                            found in the gastric fundus and in the gastric                            body. Biopsies were taken with a cold forceps for                            histology.                           Diffuse moderate inflammation characterized by                            erythema, friability and granularity was found in                            the cardia, in the  gastric fundus and in the                            gastric body. Biopsies were taken with a cold                            forceps for histology from antrum, body and fundus                            separately.                           A small hiatal hernia was present.                           The exam of the stomach was otherwise normal.                           The duodenal bulb and second portion of the                            duodenum were normal. Complications:            No immediate complications. Impression:               - Normal esophagus.                           - Seven mucosal papules (nodules) found in the                            stomach. Biopsied.                           - Gastritis. Biopsied.                           - Small hiatal hernia.                           - Normal duodenal bulb and second portion of the                            duodenum. Recommendation:           - Patient has a contact number available for                            emergencies. The  signs and symptoms of potential                            delayed complications were discussed with the                            patient. Return to normal activities tomorrow.                            Written discharge  instructions were provided to the                            patient.                           - Resume previous diet.                           - Continue present medications.                           - Await pathology results.                           - Repeat upper endoscopy in 2 years for                            surveillance based on pathology results. Ladene Artist, MD 12/29/2018 10:41:37 AM This report has been signed electronically.

## 2018-12-29 NOTE — Progress Notes (Signed)
Called to room to assist during endoscopic procedure.  Patient ID and intended procedure confirmed with present staff. Received instructions for my participation in the procedure from the performing physician.  

## 2018-12-29 NOTE — Progress Notes (Signed)
Report given to PACU, vss 

## 2018-12-29 NOTE — Progress Notes (Signed)
Covid screening and temp  by TW.  Vital signs by JB.

## 2018-12-29 NOTE — Patient Instructions (Signed)
YOU HAD AN ENDOSCOPIC PROCEDURE TODAY AT [facility]:   Refer to the procedure report that was given to you for any specific questions about what was found during the examination.  If the procedure report does not answer your questions, please call your gastroenterologist to clarify.  If you requested that your care partner not be given the details of your procedure findings, then the procedure report has been included in a sealed envelope for you to review at your convenience later.  YOU SHOULD EXPECT: Some feelings of bloating in the abdomen. Passage of more gas than usual.  Walking can help get rid of the air that was put into your GI tract during the procedure and reduce the bloating. If you had a lower endoscopy (such as a colonoscopy or flexible sigmoidoscopy) you may notice spotting of blood in your stool or on the toilet paper. If you underwent a bowel prep for your procedure, you may not have a normal bowel movement for a few days.  Please Note:  You might notice some irritation and congestion in your nose or some drainage.  This is from the oxygen used during your procedure.  There is no need for concern and it should clear up in a day or so.  SYMPTOMS TO REPORT IMMEDIATELY:    Following upper endoscopy (EGD)  Vomiting of blood or coffee ground material  New chest pain or pain under the shoulder blades  Painful or persistently difficult swallowing  New shortness of breath  Fever of 100F or higher  Black, tarry-looking stools  Please see handout on hiatal hernia.  For urgent or emergent issues, a gastroenterologist can be reached at any hour by calling [phone].   DIET:  We do recommend a small meal at first, but then you may proceed to your regular diet.  Drink plenty of fluids but you should avoid alcoholic beverages for 24 hours.  ACTIVITY:  You should plan to take it easy for the rest of today and you should NOT DRIVE or use heavy machinery until tomorrow  (because of the sedation medicines used during the test).    FOLLOW UP: Our staff will call the number listed on your records 48-72 hours following your procedure to check on you and address any questions or concerns that you may have regarding the information given to you following your procedure. If we do not reach you, we will leave a message.  We will attempt to reach you two times.  During this call, we will ask if you have developed any symptoms of COVID 19. If you develop any symptoms (for example fever, flu-like symptoms, shortness of breath, cough etc.) before then, please call [phone].  If any biopsies were taken you will be contacted by phone or by letter within the next 1-3 weeks.  Please call us at [phone] if you have not heard about the biopsies in 3 weeks.    SIGNATURES/CONFIDENTIALITY: You and/or your care partner have signed paperwork which will be entered into your electronic medical record.  These signatures attest to the fact that that the information above on your After Visit Summary has been reviewed and is understood.  Full responsibility of the confidentiality of this discharge information lies with you and/or your care-partner.   Thank you for allowing [place] to provide your healthcare today.

## 2018-12-31 ENCOUNTER — Telehealth: Payer: Self-pay | Admitting: *Deleted

## 2018-12-31 NOTE — Telephone Encounter (Signed)
  Follow up Call-  Call back number 12/29/2018 12/04/2016  Post procedure Call Back phone  # [phone]  Permission to leave phone message Yes Yes  Some recent data might be hidden     Patient questions:  Do you have a fever, pain , or abdominal swelling? No. Pain Score  0 *  Have you tolerated food without any problems? yes  Have you been able to return to your normal activities? Yes.    Do you have any questions about your discharge instructions: Diet   No. Medications  No. Follow up visit  No.  Do you have questions or concerns about your Care? No.  Actions: * If pain score is 4 or above: No action needed, pain <4.     1. Have you developed a fever since your procedure? no  2.   Have you had an respiratory symptoms (SOB or cough) since your procedure? no  3.   Have you tested positive for COVID 19 since your procedure no  4.   Have you had any family members/close contacts diagnosed with the COVID 19 since your procedure?  no   If any of these questions are a yes, please inquire if patient has been seen by family doctor and route this note to Joylene John, Therapist, sports.

## 2019-01-12 ENCOUNTER — Encounter: Payer: Self-pay | Admitting: Gastroenterology

## 2019-02-22 ENCOUNTER — Other Ambulatory Visit: Payer: Self-pay | Admitting: Obstetrics and Gynecology

## 2019-02-22 DIAGNOSIS — Z803 Family history of malignant neoplasm of breast: Secondary | ICD-10-CM

## 2019-03-13 ENCOUNTER — Ambulatory Visit
Admission: RE | Admit: 2019-03-13 | Discharge: 2019-03-13 | Disposition: A | Discharge status: Home / Self Care | Payer: No Typology Code available for payment source | Source: Ambulatory Visit | Attending: Obstetrics and Gynecology | Admitting: Obstetrics and Gynecology

## 2019-03-13 DIAGNOSIS — Z803 Family history of malignant neoplasm of breast: Secondary | ICD-10-CM

## 2019-03-13 MED ORDER — GADOBUTROL 1 MMOL/ML IV SOLN
7.0000 mL | Freq: Once | INTRAVENOUS | Status: AC | PRN
  Administered 2019-03-13: 7 mL via INTRAVENOUS

## 2019-03-14 ENCOUNTER — Other Ambulatory Visit: Payer: Self-pay | Admitting: Obstetrics and Gynecology

## 2019-03-14 DIAGNOSIS — R9389 Abnormal findings on diagnostic imaging of other specified body structures: Secondary | ICD-10-CM

## 2019-03-16 ENCOUNTER — Other Ambulatory Visit: Payer: Self-pay

## 2019-03-16 ENCOUNTER — Other Ambulatory Visit: Payer: Self-pay | Admitting: Obstetrics and Gynecology

## 2019-03-16 ENCOUNTER — Other Ambulatory Visit: Payer: Self-pay | Admitting: Diagnostic Radiology

## 2019-03-16 ENCOUNTER — Ambulatory Visit
Admission: RE | Admit: 2019-03-16 | Discharge: 2019-03-16 | Disposition: A | Discharge status: Home / Self Care | Payer: No Typology Code available for payment source | Source: Ambulatory Visit | Attending: Obstetrics and Gynecology | Admitting: Obstetrics and Gynecology

## 2019-03-16 ENCOUNTER — Ambulatory Visit
Admission: RE | Admit: 2019-03-16 | Discharge: 2019-03-16 | Disposition: A | Discharge status: Home / Self Care | Payer: 59 | Source: Ambulatory Visit | Attending: Obstetrics and Gynecology | Admitting: Obstetrics and Gynecology

## 2019-03-16 DIAGNOSIS — R9389 Abnormal findings on diagnostic imaging of other specified body structures: Secondary | ICD-10-CM

## 2019-07-20 ENCOUNTER — Other Ambulatory Visit: Payer: Self-pay | Admitting: Family Medicine

## 2019-07-20 DIAGNOSIS — Z1231 Encounter for screening mammogram for malignant neoplasm of breast: Secondary | ICD-10-CM

## 2019-08-21 ENCOUNTER — Other Ambulatory Visit: Payer: Self-pay | Admitting: Family Medicine

## 2019-08-21 DIAGNOSIS — N6002 Solitary cyst of left breast: Secondary | ICD-10-CM

## 2019-09-05 ENCOUNTER — Other Ambulatory Visit: Payer: Self-pay

## 2019-09-05 ENCOUNTER — Ambulatory Visit
Admission: RE | Admit: 2019-09-05 | Discharge: 2019-09-05 | Disposition: A | Discharge status: Home / Self Care | Payer: 59 | Source: Ambulatory Visit | Attending: Family Medicine | Admitting: Family Medicine

## 2019-09-05 ENCOUNTER — Ambulatory Visit
Admission: RE | Admit: 2019-09-05 | Discharge: 2019-09-05 | Disposition: A | Discharge status: Home / Self Care | Payer: No Typology Code available for payment source | Source: Ambulatory Visit | Attending: Family Medicine | Admitting: Family Medicine

## 2019-09-05 DIAGNOSIS — N6002 Solitary cyst of left breast: Secondary | ICD-10-CM

## 2019-09-08 ENCOUNTER — Other Ambulatory Visit: Payer: No Typology Code available for payment source

## 2019-09-08 ENCOUNTER — Ambulatory Visit: Payer: No Typology Code available for payment source

## 2019-09-20 ENCOUNTER — Other Ambulatory Visit: Payer: Self-pay | Admitting: Family Medicine

## 2019-09-20 DIAGNOSIS — R6889 Other general symptoms and signs: Secondary | ICD-10-CM

## 2019-09-20 DIAGNOSIS — R0989 Other specified symptoms and signs involving the circulatory and respiratory systems: Secondary | ICD-10-CM

## 2019-09-26 ENCOUNTER — Ambulatory Visit
Admission: RE | Admit: 2019-09-26 | Discharge: 2019-09-26 | Disposition: A | Discharge status: Home / Self Care | Payer: No Typology Code available for payment source | Source: Ambulatory Visit | Attending: Family Medicine | Admitting: Family Medicine

## 2019-09-26 DIAGNOSIS — R0989 Other specified symptoms and signs involving the circulatory and respiratory systems: Secondary | ICD-10-CM

## 2019-09-26 DIAGNOSIS — R6889 Other general symptoms and signs: Secondary | ICD-10-CM

## 2019-10-16 ENCOUNTER — Ambulatory Visit: Payer: 59 | Attending: Internal Medicine

## 2019-10-16 DIAGNOSIS — Z23 Encounter for immunization: Secondary | ICD-10-CM | POA: Insufficient documentation

## 2019-10-16 NOTE — Progress Notes (Signed)
   Covid-19 Vaccination Clinic  Name:  Joan Cobb    MRN: [id number] DOB: 09-Dec-1960  10/16/2019  Joan Cobb was observed post Covid-19 immunization for 15 minutes without incident. She was provided with Vaccine Information Sheet and instruction to access the V-Safe system.   Joan Cobb was instructed to call 911 with any severe reactions post vaccine: Marland Kitchen Difficulty breathing  . Swelling of face and throat  . A fast heartbeat  . A bad rash all over body  . Dizziness and weakness   Immunizations Administered    Name Date Dose VIS Date Route   Pfizer COVID-19 Vaccine 10/16/2019  1:34 PM 0.3 mL 07/21/2019 Intramuscular   Manufacturer: [place]   Lot: [id number]   [place]: [id number]

## 2019-11-15 ENCOUNTER — Ambulatory Visit: Payer: 59 | Attending: Internal Medicine

## 2019-11-15 DIAGNOSIS — Z23 Encounter for immunization: Secondary | ICD-10-CM

## 2019-11-15 NOTE — Progress Notes (Signed)
   Covid-19 Vaccination Clinic  Name:  Selyna Pradia    MRN: [id number] DOB: [birth year]  11/15/2019  Ms. Paskins was observed post Covid-19 immunization for 15 minutes without incident. She was provided with Vaccine Information Sheet and instruction to access the V-Safe system.   Ms. Kneer was instructed to call 911 with any severe reactions post vaccine: Marland Kitchen Difficulty breathing  . Swelling of face and throat  . A fast heartbeat  . A bad rash all over body  . Dizziness and weakness   Immunizations Administered    Name Date Dose VIS Date Route   Pfizer COVID-19 Vaccine 11/15/2019 12:01 PM 0.3 mL 07/21/2019 Intramuscular   Manufacturer: [place]   Lot: [id number]   [place]: [id number]

## 2020-01-12 ENCOUNTER — Other Ambulatory Visit: Payer: Self-pay | Admitting: Obstetrics and Gynecology

## 2020-01-12 DIAGNOSIS — Z803 Family history of malignant neoplasm of breast: Secondary | ICD-10-CM

## 2020-01-30 ENCOUNTER — Other Ambulatory Visit: Payer: Self-pay | Admitting: Family Medicine

## 2020-01-30 DIAGNOSIS — Z803 Family history of malignant neoplasm of breast: Secondary | ICD-10-CM

## 2020-02-20 ENCOUNTER — Ambulatory Visit: Payer: 59

## 2020-02-21 ENCOUNTER — Ambulatory Visit
Admission: RE | Admit: 2020-02-21 | Discharge: 2020-02-21 | Disposition: A | Discharge status: Home / Self Care | Payer: No Typology Code available for payment source | Source: Ambulatory Visit | Attending: Obstetrics and Gynecology | Admitting: Obstetrics and Gynecology

## 2020-02-21 DIAGNOSIS — Z803 Family history of malignant neoplasm of breast: Secondary | ICD-10-CM

## 2020-02-21 MED ORDER — GADOBUTROL 1 MMOL/ML IV SOLN
7.0000 mL | Freq: Once | INTRAVENOUS | Status: AC | PRN
  Administered 2020-02-21: 7 mL via INTRAVENOUS

## 2020-08-14 ENCOUNTER — Other Ambulatory Visit: Payer: Self-pay | Admitting: Family Medicine

## 2020-08-14 DIAGNOSIS — Z1231 Encounter for screening mammogram for malignant neoplasm of breast: Secondary | ICD-10-CM

## 2020-09-20 ENCOUNTER — Other Ambulatory Visit: Payer: Self-pay

## 2020-09-20 ENCOUNTER — Ambulatory Visit
Admission: RE | Admit: 2020-09-20 | Discharge: 2020-09-20 | Disposition: A | Discharge status: Home / Self Care | Payer: 59 | Source: Ambulatory Visit | Attending: Family Medicine | Admitting: Family Medicine

## 2020-09-20 DIAGNOSIS — Z1231 Encounter for screening mammogram for malignant neoplasm of breast: Secondary | ICD-10-CM

## 2020-10-30 ENCOUNTER — Other Ambulatory Visit: Payer: Self-pay | Admitting: Family Medicine

## 2020-10-30 ENCOUNTER — Ambulatory Visit
Admission: RE | Admit: 2020-10-30 | Discharge: 2020-10-30 | Disposition: A | Discharge status: Home / Self Care | Payer: 59 | Source: Ambulatory Visit | Attending: Family Medicine | Admitting: Family Medicine

## 2020-10-30 DIAGNOSIS — R059 Cough, unspecified: Secondary | ICD-10-CM

## 2021-01-20 ENCOUNTER — Other Ambulatory Visit: Payer: Self-pay | Admitting: Family Medicine

## 2021-01-20 DIAGNOSIS — N6310 Unspecified lump in the right breast, unspecified quadrant: Secondary | ICD-10-CM

## 2021-01-21 ENCOUNTER — Other Ambulatory Visit: Payer: Self-pay | Admitting: Family Medicine

## 2021-01-21 DIAGNOSIS — N63 Unspecified lump in unspecified breast: Secondary | ICD-10-CM

## 2021-01-23 ENCOUNTER — Ambulatory Visit
Admission: RE | Admit: 2021-01-23 | Discharge: 2021-01-23 | Disposition: A | Discharge status: Home / Self Care | Payer: 59 | Source: Ambulatory Visit | Attending: Family Medicine | Admitting: Family Medicine

## 2021-01-23 ENCOUNTER — Other Ambulatory Visit: Payer: Self-pay

## 2021-01-23 DIAGNOSIS — N6310 Unspecified lump in the right breast, unspecified quadrant: Secondary | ICD-10-CM

## 2021-01-28 ENCOUNTER — Other Ambulatory Visit: Payer: 59

## 2021-02-20 ENCOUNTER — Ambulatory Visit
Admission: RE | Admit: 2021-02-20 | Discharge: 2021-02-20 | Disposition: A | Discharge status: Home / Self Care | Payer: 59 | Source: Ambulatory Visit | Attending: Family Medicine | Admitting: Family Medicine

## 2021-02-20 DIAGNOSIS — N63 Unspecified lump in unspecified breast: Secondary | ICD-10-CM

## 2021-02-20 MED ORDER — GADOBUTROL 1 MMOL/ML IV SOLN
7.0000 mL | Freq: Once | INTRAVENOUS | Status: AC | PRN
  Administered 2021-02-20: 7 mL via INTRAVENOUS

## 2021-02-21 ENCOUNTER — Other Ambulatory Visit: Payer: Self-pay | Admitting: Family Medicine

## 2021-02-21 DIAGNOSIS — R9389 Abnormal findings on diagnostic imaging of other specified body structures: Secondary | ICD-10-CM

## 2021-02-25 ENCOUNTER — Other Ambulatory Visit: Payer: 59

## 2021-02-26 ENCOUNTER — Ambulatory Visit
Admission: RE | Admit: 2021-02-26 | Discharge: 2021-02-26 | Disposition: A | Discharge status: Home / Self Care | Payer: 59 | Source: Ambulatory Visit | Attending: Family Medicine | Admitting: Family Medicine

## 2021-02-26 ENCOUNTER — Other Ambulatory Visit: Payer: Self-pay

## 2021-02-26 DIAGNOSIS — R9389 Abnormal findings on diagnostic imaging of other specified body structures: Secondary | ICD-10-CM

## 2021-02-26 MED ORDER — GADOBUTROL 1 MMOL/ML IV SOLN
7.0000 mL | Freq: Once | INTRAVENOUS | Status: AC | PRN
  Administered 2021-02-26: 7 mL via INTRAVENOUS

## 2021-03-31 ENCOUNTER — Encounter: Payer: Self-pay | Admitting: Gastroenterology

## 2021-06-16 ENCOUNTER — Encounter: Payer: Self-pay | Admitting: *Deleted

## 2021-06-16 ENCOUNTER — Ambulatory Visit: Payer: 59 | Admitting: Physician Assistant

## 2021-06-16 ENCOUNTER — Encounter: Payer: Self-pay | Admitting: Physician Assistant

## 2021-06-16 VITALS — BP 124/80 | HR 88 | Ht 65.0 in | Wt 156.0 lb

## 2021-06-16 DIAGNOSIS — D509 Iron deficiency anemia, unspecified: Secondary | ICD-10-CM | POA: Diagnosis not present

## 2021-06-16 DIAGNOSIS — K219 Gastro-esophageal reflux disease without esophagitis: Secondary | ICD-10-CM

## 2021-06-16 DIAGNOSIS — K2940 Chronic atrophic gastritis without bleeding: Secondary | ICD-10-CM | POA: Diagnosis not present

## 2021-06-16 MED ORDER — NA SULFATE-K SULFATE-MG SULF 17.5-3.13-1.6 GM/177ML PO SOLN: 1.0000 | Freq: Once | ORAL | 0 refills | Status: AC

## 2021-06-16 NOTE — Patient Instructions (Signed)
You have been scheduled for an endoscopy and colonoscopy. Please follow the written instructions given to you at your visit today. Please pick up your prep supplies at the pharmacy within the next 1-3 days. If you use inhalers (even only as needed), please bring them with you on the day of your procedure.  If you are age 60 or older, your body mass index should be between 23-30. Your Body mass index is 25.96 kg/m. If this is out of the aforementioned range listed, please consider follow up with your Primary Care Provider.  If you are age 75 or younger, your body mass index should be between 19-25. Your Body mass index is 25.96 kg/m. If this is out of the aformentioned range listed, please consider follow up with your Primary Care Provider.   ________________________________________________________  The [facility] providers would like to encourage you to use [facility] to communicate with providers for non-urgent requests or questions.  Due to long hold times on the telephone, sending your provider a message by [facility] may be a faster and more efficient way to get a response.  Please allow 48 business hours for a response.  Please remember that this is for non-urgent requests.  _______________________________________________________

## 2021-06-16 NOTE — Progress Notes (Signed)
 Chief Complaint: Iron deficiency anemia and Hemoccult positive stools  HPI:    Joan Cobb is a 60-year-old female with a past medical history as listed below including iron deficiency anemia prior to hysterectomy, known to Dr. Stark, who was referred to me by Blomgren, Peter, MD for a complaint of iron deficiency anemia and Hemoccult positive stools.    12/04/2016 colonoscopy done for prior history of adenomatous polyps 5 years prior with 2 further adenomatous polyps of 6 mm in the descending and transverse colon, diverticulosis in the sigmoid colon and otherwise normal.  Repeat recommended in 5 years.    12/29/2018 EGD within normal esophagus, 7 nodules in the stomach, gastritis and a small hiatal hernia.  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  Repeat was recommended in 2 years.    04/09/2021 hemoglobin 13.6, MCHC 31.5.  We do not have iron studies.    05/15/2021 PCP discussed that she had recent Hemoccult slides 2 out of the 3 which came back positive, these were collected because her MCHC was minimally low at 31.5.  Her iron saturation was normal but her ferritin was a little low at 12.  Her PCP wondered if she needed a colonoscopy with the EGD which was recommended.    Today, the patient tells me that she has been feeling okay.  She does have chronic reflux symptoms and is currently on Nexium 40 mg twice daily as well as Famotidine 40 mg nightly.  Does have some breakthrough symptoms.  Tells me that she was also experiencing a chronic cough but has recently been started on a steroid inhaler which has relieved this symptom and her PCP thinks it is more related to her asthma.  She is here to discuss her recent findings of iron deficiency anemia and need for repeat EGD/possible colonoscopy.  Tells me that she has not been seeing any blood in her stool and has no new abdominal pain or change in bowel habits.    Denies fever, chills, weight loss or symptoms that  awaken her from sleep.     Past Medical History:  Diagnosis Date   Adenomatous colon polyp 05/2007   Allergic rhinitis    Allergy    Anxiety    Asthma    with allergies   Breast mass 10/13/2016   Complication of anesthesia    states she is hyperactive post op with every past surgery   Gastric polyps    Hyperlipidemia    Iron deficiency anemia    prior to hysterectomy   Retinal tear of left eye    Vitamin B12 deficiency     Past Surgical History:  Procedure Laterality Date   ABDOMINAL HYSTERECTOMY     BILATERAL SALPINGECTOMY Bilateral 12/22/2013   Procedure: BILATERAL SALPINGECTOMY;  Surgeon: Kelly A. Fogleman, MD;  Location: [facility];  Service: Gynecology;  Laterality: Bilateral;   BREAST BIOPSY     left   BREAST CYST ASPIRATION  march and june 2015   multiple cyst aspirations in office procedure    BREAST EXCISIONAL BIOPSY Right 2016   BREAST EXCISIONAL BIOPSY Left    BREAST LUMPECTOMY WITH RADIOACTIVE SEED LOCALIZATION Right 02/20/2015   Procedure: RIGHT BREAST LUMPECTOMY WITH RADIOACTIVE SEED LOCALIZATION;  Surgeon: Haywood Ingram, MD;  Location: [facility];  Service: General;  Laterality: Right;   COLONOSCOPY     CYSTOCELE REPAIR     Left hand surgery     POLYPECTOMY     RECTOCELE REPAIR       RETINAL TEAR REPAIR CRYOTHERAPY     ROBOTIC ASSISTED TOTAL HYSTERECTOMY N/A 12/22/2013   Procedure: ROBOTIC ASSISTED TOTAL HYSTERECTOMY With Pelvic Washings;  Surgeon: Floyce Stakes. Pamala Hurry, MD;  Location: [facility];  Service: Gynecology;  Laterality: N/A;   TOE SURGERY     left   TONSILLECTOMY     UPPER GASTROINTESTINAL ENDOSCOPY     vaginal deliveries  1990, 1995   WISDOM TOOTH EXTRACTION Bilateral     Current Outpatient Medications  Medication Sig Dispense Refill   acetaminophen (TYLENOL) 500 MG tablet Take 1,000 mg by mouth as needed.     ADVAIR DISKUS 250-50 MCG/ACT AEPB Inhale 1 puff into the lungs 2 (two) times daily.     albuterol (PROVENTIL HFA;VENTOLIN  HFA) 108 (90 BASE) MCG/ACT inhaler Inhale 2 puffs into the lungs every 6 (six) hours as needed for wheezing. With allergies     ALPRAZolam (XANAX) 0.5 MG tablet Take 0.5 mg by mouth at bedtime as needed for anxiety.     AMBULATORY NON FORMULARY MEDICATION Hormone pellets 1 injection every 4 months     CALCIUM CITRATE PO Take 1,000 mg by mouth in the morning.     carboxymethylcellulose (REFRESH PLUS) 0.5 % SOLN 1 drop 3 (three) times daily as needed.     ciprofloxacin (CIPRO) 250 MG tablet Take 250 mg by mouth as needed.     Cyanocobalamin (VITAMIN B-12 CR PO) Take by mouth daily.     cyclobenzaprine (FLEXERIL) 10 MG tablet Take 10 mg by mouth at bedtime as needed.     docusate sodium (COLACE) 100 MG capsule Take 200 mg by mouth at bedtime.     esomeprazole (NEXIUM) 40 MG capsule Take 40 mg by mouth 2 (two) times daily before a meal.     famotidine (PEPCID) 40 MG tablet Take 40 mg by mouth at bedtime.     fluticasone (FLONASE) 50 MCG/ACT nasal spray Place 1-2 sprays into both nostrils daily as needed for allergies or rhinitis.     ketotifen (ZADITOR) 0.025 % ophthalmic solution 1 drop 2 (two) times daily.     levocetirizine (XYZAL) 5 MG tablet Take 5 mg by mouth every evening.     Na Sulfate-K Sulfate-Mg Sulf 17.5-3.13-1.6 GM/177ML SOLN Take 1 kit by mouth once for 1 dose. 354 mL 0   olopatadine (PATANOL) 0.1 % ophthalmic solution olopatadine 0.1 % eye drops     Probiotic Product (FLORAJEN DIGESTION PO) Take 1 capsule by mouth every morning.     progesterone (PROMETRIUM) 200 MG capsule Take 200 mg by mouth daily.     rosuvastatin (CRESTOR) 10 MG tablet Take 10 mg by mouth daily.     triamcinolone cream (KENALOG) 0.1 % triamcinolone acetonide 0.1 % topical cream     Current Facility-Administered Medications  Medication Dose Route Frequency Provider Last Rate Last Admin   0.9 %  sodium chloride infusion  500 mL Intravenous Continuous Ladene Artist, MD        Allergies as of 06/16/2021 -  Review Complete 06/16/2021  Allergen Reaction Noted   Other  11/20/2016   Percocet [oxycodone-acetaminophen] Other (See Comments) 08/24/2011   Tape Rash 03/28/2018    Family History  Problem Relation Age of Onset   Heart disease Father    Heart disease Sister    Colon cancer Neg Hx    Esophageal cancer Neg Hx    Rectal cancer Neg Hx    Stomach cancer Neg Hx    Breast cancer Neg Hx  Colon polyps Neg Hx     Social History   Socioeconomic History   Marital status: Married    Spouse name: Not on file   Number of children: 2   Years of education: Not on file   Highest education level: Not on file  Occupational History    Employer: [employer]  Tobacco Use   Smoking status: Never   Smokeless tobacco: Never  Vaping Use   Vaping Use: Never used  Substance and Sexual Activity   Alcohol use: Yes    Alcohol/week: 2.0 - 3.0 standard drinks    Types: 2 - 3 Glasses of wine per week    Comment: 1 glass of wine per day   Drug use: No   Sexual activity: Yes    Birth control/protection: Surgical    Comment: husband had vasecctomy  Other Topics Concern   Not on file  Social History Narrative   Not on file   Social Determinants of Health   Financial Resource Strain: Not on file  Food Insecurity: Not on file  Transportation Needs: Not on file  Physical Activity: Not on file  Stress: Not on file  Social Connections: Not on file  Intimate Partner Violence: Not on file    Review of Systems:    Constitutional: No weight loss, fever or chills Cardiovascular: No chest pain Respiratory: No SOB Gastrointestinal: See HPI and otherwise negative   Physical Exam:  Vital signs: BP 124/80 (BP Location: Left Arm, Patient Position: Sitting, Cuff Size: Normal)   Pulse 88   Ht 5' 5" (1.651 m) Comment: height measured without shoes  Wt 156 lb (70.8 kg)   LMP 11/10/2013   BMI 25.96 kg/m   Constitutional:   Pleasant Caucasian female appears to be in NAD, Well developed, Well  nourished, alert and cooperative  Respiratory: Respirations even and unlabored. Lungs clear to auscultation bilaterally.   No wheezes, crackles, or rhonchi.  Cardiovascular: Normal S1, S2. No MRG. Regular rate and rhythm. No peripheral edema, cyanosis or pallor.  Gastrointestinal:  Soft, nondistended, nontender. No rebound or guarding. Normal bowel sounds. No appreciable masses or hepatomegaly. Psychiatric: Oriented to person, place and time. Demonstrates good judgement and reason without abnormal affect or behaviors.  See HPI for recent labs and imaging.  Assessment: 1.  Iron deficiency anemia: Recently low MCHC, normal hemoglobin, ferritin low, history of EGD in 2020 with findings of atrophic gastritis and intestinal metaplasia with recommendations to repeat in 5 years, last colonoscopy in 2018 for history of adenomatous polyps with recommendations to repeat in 5 years, chronic reflux symptoms but no new GI symptoms; consider advancement of intestinal metaplasia versus AVMs versus other 2.  GERD: History of atrophic gastritis on last EGD, continued symptoms on Nexium 40 twice a day and Famotidine and nightly  Plan: 1.  Scheduled patient for diagnostic EGD and colonoscopy in the [facility] with Dr. Stark.  Did provide the patient a detailed list of risks for the procedures and she agrees to proceed. Patient is appropriate for endoscopic procedure(s) in the ambulatory ([facility]) setting.  2.  Continue Nexium 40 twice daily and Famotidine 40 nightly 3.  Pending EGD could consider adding Carafate for atrophic gastritis 4.  Patient to follow in clinic per recommendations from Dr. Stark after time of procedures.  Jennifer Lemmon, PA-C [facility] 06/16/2021, 1:45 PM  Cc: Blomgren, Peter, MD  

## 2021-06-16 NOTE — Progress Notes (Signed)
Reviewed and agree with management plan.  Keymari Sato T. Marquel Spoto, MD FACG 

## 2021-07-11 ENCOUNTER — Other Ambulatory Visit: Payer: Self-pay | Admitting: Physician Assistant

## 2021-07-11 NOTE — Progress Notes (Signed)
[facility] 07/11/2021 2:19 PM  Received recent lab work for the patient from her PCP.  07/01/2021 iron studies with a total iron at 64, TIBC normal at 431, percent saturation low at 15%, ferritin low at 10.  CMP was normal.  CBC with a hemoglobin of 13.7 and otherwise normal.  Patient is scheduled for EGD and colonoscopy next week.  May need to consider Feraheme infusions.  Ellouise Newer, PA-C

## 2021-07-16 ENCOUNTER — Other Ambulatory Visit: Payer: Self-pay

## 2021-07-16 ENCOUNTER — Ambulatory Visit (AMBULATORY_SURGERY_CENTER): Payer: 59 | Admitting: Gastroenterology

## 2021-07-16 ENCOUNTER — Encounter: Payer: Self-pay | Admitting: Gastroenterology

## 2021-07-16 VITALS — BP 130/71 | HR 61 | Temp 98.6°F | Resp 10 | Ht 65.0 in | Wt 156.0 lb

## 2021-07-16 DIAGNOSIS — K640 First degree hemorrhoids: Secondary | ICD-10-CM

## 2021-07-16 DIAGNOSIS — K219 Gastro-esophageal reflux disease without esophagitis: Secondary | ICD-10-CM | POA: Diagnosis not present

## 2021-07-16 DIAGNOSIS — K2970 Gastritis, unspecified, without bleeding: Secondary | ICD-10-CM

## 2021-07-16 DIAGNOSIS — K449 Diaphragmatic hernia without obstruction or gangrene: Secondary | ICD-10-CM | POA: Diagnosis not present

## 2021-07-16 DIAGNOSIS — K31A0 Gastric intestinal metaplasia, unspecified: Secondary | ICD-10-CM | POA: Diagnosis not present

## 2021-07-16 DIAGNOSIS — D127 Benign neoplasm of rectosigmoid junction: Secondary | ICD-10-CM

## 2021-07-16 DIAGNOSIS — R195 Other fecal abnormalities: Secondary | ICD-10-CM | POA: Diagnosis not present

## 2021-07-16 DIAGNOSIS — D509 Iron deficiency anemia, unspecified: Secondary | ICD-10-CM | POA: Diagnosis not present

## 2021-07-16 DIAGNOSIS — Z86010 Personal history of colonic polyps: Secondary | ICD-10-CM

## 2021-07-16 DIAGNOSIS — K2951 Unspecified chronic gastritis with bleeding: Secondary | ICD-10-CM | POA: Diagnosis not present

## 2021-07-16 DIAGNOSIS — D125 Benign neoplasm of sigmoid colon: Secondary | ICD-10-CM | POA: Diagnosis not present

## 2021-07-16 DIAGNOSIS — D128 Benign neoplasm of rectum: Secondary | ICD-10-CM | POA: Diagnosis not present

## 2021-07-16 MED ORDER — FERROUS SULFATE 300 (60 FE) MG/5ML PO SYRP: 325.0000 mg | ORAL_SOLUTION | Freq: Every day | ORAL | 2 refills | Status: DC

## 2021-07-16 MED ORDER — SODIUM CHLORIDE 0.9 % IV SOLN: 500.0000 mL | Freq: Once | INTRAVENOUS | Status: DC

## 2021-07-16 NOTE — Op Note (Signed)
[place] Patient Name: Joan Cobb Procedure Date: 07/16/2021 2:33 PM MRN: [id number] Endoscopist: Ladene Artist , MD Age: 60 Referring MD:  Date of Birth: 09-08-1960 Gender: Female Account #: [id number] Procedure:                Upper GI endoscopy Indications:              Unexplained iron deficiency anemia,                            Gastroesophageal reflux disease, Heme positive                            stool, Follow-up of intestinal metaplasia Medicines:                Monitored Anesthesia Care Procedure:                Pre-Anesthesia Assessment:                           - Prior to the procedure, a History and Physical                            was performed, and patient medications and                            allergies were reviewed. The patient's tolerance of                            previous anesthesia was also reviewed. The risks                            and benefits of the procedure and the sedation                            options and risks were discussed with the patient.                            All questions were answered, and informed consent                            was obtained. Prior Anticoagulants: The patient has                            taken no previous anticoagulant or antiplatelet                            agents. ASA Grade Assessment: II - A patient with                            mild systemic disease. After reviewing the risks                            and benefits, the patient was deemed in  satisfactory condition to undergo the procedure.                           After obtaining informed consent, the endoscope was                            passed under direct vision. Throughout the                            procedure, the patient's blood pressure, pulse, and                            oxygen saturations were monitored continuously. The                            GIF [id number] #[id number] was  introduced through the                            mouth, and advanced to the second part of duodenum.                            The upper GI endoscopy was somewhat difficult due                            to inability to fully insufflate the stomach with                            persistent belching. The patient tolerated the                            procedure well. Scope In: Scope Out: Findings:                 The lumen of the esophagus was mildly dilated.                           The exam of the esophagus was otherwise normal.                           A small hiatal hernia was present.                           Diffuse mild inflammation characterized by erythema                            and granularity was found in the gastric fundus and                            in the gastric body. Biopsies were taken with a                            cold forceps for histology, gastric mapping.  The exam of the stomach was otherwise normal.                            Biopsies were taken with a cold forceps, gastric                            mapping.                           The duodenal bulb and second portion of the                            duodenum were normal. Biopsies for histology were                            taken with a cold forceps for evaluation of celiac                            disease. Complications:            No immediate complications. Estimated Blood Loss:     Estimated blood loss was minimal. Impression:               - Mild dilation in the entire esophagus.                           - Small hiatal hernia.                           - Gastritis. Biopsied.                           - Normal duodenal bulb and second portion of the                            duodenum. Biopsied. Recommendation:           - Patient has a contact number available for                            emergencies. The signs and symptoms of potential                             delayed complications were discussed with the                            patient. Return to normal activities tomorrow.                            Written discharge instructions were provided to the                            patient.                           - Resume previous diet.                           -  Follow antireflux measures long term.                           - Continue present medications.                           - FeSO4 325 mg po bid with food, #60, 2 refills.                           - Await pathology results.                           - Repeat upper endoscopy for surveillance based on                            pathology results.                           - Consider VCE if iron deficiency recurs or does                            not correct.                           - GI office visit in 2 months. Ladene Artist, MD 07/16/2021 3:21:24 PM This report has been signed electronically.

## 2021-07-16 NOTE — Progress Notes (Signed)
C.W. vital signs. 

## 2021-07-16 NOTE — Patient Instructions (Signed)
YOU HAD AN ENDOSCOPIC PROCEDURE TODAY AT [facility]:   Refer to the procedure report that was given to you for any specific questions about what was found during the examination.  If the procedure report does not answer your questions, please call your gastroenterologist to clarify.  If you requested that your care partner not be given the details of your procedure findings, then the procedure report has been included in a sealed envelope for you to review at your convenience later.  **Handouts given on polyps and hemorrhoids**  YOU SHOULD EXPECT: Some feelings of bloating in the abdomen. Passage of more gas than usual.  Walking can help get rid of the air that was put into your GI tract during the procedure and reduce the bloating. If you had a lower endoscopy (such as a colonoscopy or flexible sigmoidoscopy) you may notice spotting of blood in your stool or on the toilet paper. If you underwent a bowel prep for your procedure, you may not have a normal bowel movement for a few days.  Please Note:  You might notice some irritation and congestion in your nose or some drainage.  This is from the oxygen used during your procedure.  There is no need for concern and it should clear up in a day or so.  SYMPTOMS TO REPORT IMMEDIATELY:  Following lower endoscopy (colonoscopy or flexible sigmoidoscopy):  Excessive amounts of blood in the stool  Significant tenderness or worsening of abdominal pains  Swelling of the abdomen that is new, acute  Fever of 100F or higher  Following upper endoscopy (EGD)  Vomiting of blood or coffee ground material  New chest pain or pain under the shoulder blades  Painful or persistently difficult swallowing  New shortness of breath  Fever of 100F or higher  Black, tarry-looking stools  For urgent or emergent issues, a gastroenterologist can be reached at any hour by calling [phone]. Do not use MyChart messaging for urgent concerns.    DIET:   We do recommend a small meal at first, but then you may proceed to your regular diet.  Drink plenty of fluids but you should avoid alcoholic beverages for 24 hours.  ACTIVITY:  You should plan to take it easy for the rest of today and you should NOT DRIVE or use heavy machinery until tomorrow (because of the sedation medicines used during the test).    FOLLOW UP: Our staff will call the number listed on your records 48-72 hours following your procedure to check on you and address any questions or concerns that you may have regarding the information given to you following your procedure. If we do not reach you, we will leave a message.  We will attempt to reach you two times.  During this call, we will ask if you have developed any symptoms of COVID 19. If you develop any symptoms (ie: fever, flu-like symptoms, shortness of breath, cough etc.) before then, please call [phone].  If you test positive for Covid 19 in the 2 weeks post procedure, please call and report this information to [place].    If any biopsies were taken you will be contacted by phone or by letter within the next 1-3 weeks.  Please call us at [phone] if you have not heard about the biopsies in 3 weeks.    SIGNATURES/CONFIDENTIALITY: You and/or your care partner have signed paperwork which will be entered into your electronic medical record.  These signatures attest to the fact that  that the information above on your After Visit Summary has been reviewed and is understood.  Full responsibility of the confidentiality of this discharge information lies with you and/or your care-partner.

## 2021-07-16 NOTE — Progress Notes (Signed)
Sedate, gd SR, tolerated procedure well, VSS, report to RN 

## 2021-07-16 NOTE — Progress Notes (Signed)
Called to room to assist during endoscopic procedure.  Patient ID and intended procedure confirmed with present staff. Received instructions for my participation in the procedure from the performing physician.  

## 2021-07-16 NOTE — Progress Notes (Signed)
See 06/16/2021 H&P, no changes.

## 2021-07-16 NOTE — Progress Notes (Signed)
Pt's states no medical or surgical changes since previsit or office visit. 

## 2021-07-16 NOTE — Op Note (Signed)
[facility] Patient Name: Joan Cobb Procedure Date: 07/16/2021 2:34 PM MRN: [id number] Endoscopist: Ladene Artist , MD Age: 60 Referring MD:  Date of Birth: [birth year] Gender: Female Account #: [id number] Procedure:                Colonoscopy Indications:              Heme positive stool, Iron deficiency anemia,                            Personal history of adenomatous colon polyps. Medicines:                Monitored Anesthesia Care Procedure:                Pre-Anesthesia Assessment:                           - Prior to the procedure, a History and Physical                            was performed, and patient medications and                            allergies were reviewed. The patient's tolerance of                            previous anesthesia was also reviewed. The risks                            and benefits of the procedure and the sedation                            options and risks were discussed with the patient.                            All questions were answered, and informed consent                            was obtained. Prior Anticoagulants: The patient has                            taken no previous anticoagulant or antiplatelet                            agents. ASA Grade Assessment: II - A patient with                            mild systemic disease. After reviewing the risks                            and benefits, the patient was deemed in                            satisfactory condition to undergo the procedure.  After obtaining informed consent, the colonoscope                            was passed under direct vision. Throughout the                            procedure, the patient's blood pressure, pulse, and                            oxygen saturations were monitored continuously. The                            Olympus PCF-[id number] (#[id number]) Colonoscope was                            introduced through the  anus and advanced to the the                            cecum, identified by appendiceal orifice and                            ileocecal valve. The ileocecal valve, appendiceal                            orifice, and rectum were photographed. The quality                            of the bowel preparation was good. The colonoscopy                            was performed without difficulty. The patient                            tolerated the procedure well. Scope In: 2:38:38 PM Scope Out: 2:58:10 PM Scope Withdrawal Time: 0 hours 14 minutes 51 seconds  Total Procedure Duration: 0 hours 19 minutes 32 seconds  Findings:                 The perianal and digital rectal examinations were                            normal.                           Two sessile polyps were found in the rectum and                            sigmoid colon. The polyps were 6 to 8 mm in size.                            These polyps were removed with a cold snare.                            Resection and retrieval were complete.  Internal hemorrhoids were found during                            retroflexion. The hemorrhoids were small and Grade                            I (internal hemorrhoids that do not prolapse).                           The exam was otherwise without abnormality on                            direct and retroflexion views. Complications:            No immediate complications. Estimated blood loss:                            None. Estimated Blood Loss:     Estimated blood loss: none. Impression:               - Two 6 to 8 mm polyps in the rectum and in the                            sigmoid colon, removed with a cold snare. Resected                            and retrieved.                           - Internal hemorrhoids.                           - The examination was otherwise normal on direct                            and retroflexion views. Recommendation:            - Repeat colonoscopy after studies are complete for                            surveillance based on pathology results.                           - Patient has a contact number available for                            emergencies. The signs and symptoms of potential                            delayed complications were discussed with the                            patient. Return to normal activities tomorrow.                            Written discharge instructions were provided to the  patient.                           - Resume previous diet.                           - Continue present medications.                           - Await pathology results. Ladene Artist, MD 07/16/2021 3:01:12 PM This report has been signed electronically.

## 2021-07-18 ENCOUNTER — Telehealth: Payer: Self-pay | Admitting: *Deleted

## 2021-07-18 ENCOUNTER — Telehealth: Payer: Self-pay | Admitting: Gastroenterology

## 2021-07-18 DIAGNOSIS — D509 Iron deficiency anemia, unspecified: Secondary | ICD-10-CM

## 2021-07-18 MED ORDER — FERROUS SULFATE 325 (65 FE) MG PO TABS: 325.0000 mg | ORAL_TABLET | Freq: Two times a day (BID) | ORAL | 2 refills | Status: DC

## 2021-07-18 NOTE — Telephone Encounter (Signed)
  Follow up Call-  Call back number 07/16/2021 12/29/2018  Post procedure Call Back phone  # [phone]  Permission to leave phone message Yes Yes  Some recent data might be hidden     Patient questions: Message left to call us if necessary.

## 2021-07-18 NOTE — Telephone Encounter (Signed)
  Follow up Call-  Call back number 07/16/2021 12/29/2018  Post procedure Call Back phone  # [phone]  Permission to leave phone message Yes Yes  Some recent data might be hidden     Patient questions:  Do you have a fever, pain , or abdominal swelling? No. Pain Score  0 *  Have you tolerated food without any problems? Yes.    Have you been able to return to your normal activities? Yes.    Do you have any questions about your discharge instructions: Diet   No. Medications  No. Follow up visit  No.  Do you have questions or concerns about your Care? No.  Actions: * If pain score is 4 or above: No action needed, pain <4.  Have you developed a fever since your procedure? no  2.   Have you had an respiratory symptoms (SOB or cough) since your procedure? no  3.   Have you tested positive for COVID 19 since your procedure no  4.   Have you had any family members/close contacts diagnosed with the COVID 19 since your procedure?  no   If yes to any of these questions please route to Joylene John, RN and Joella Prince, RN

## 2021-07-18 NOTE — Telephone Encounter (Signed)
Returned pt's call. Prescription that was sent on day of procedure was a liquid Fe and it was written for daily instead of BID per procedure report. RN reordered prescription per orders on  procedure report: Feso4 325mg  BID with food, #60, refills 2. Sent to pt's preferred pharmacy.

## 2021-08-07 ENCOUNTER — Telehealth: Payer: Self-pay | Admitting: Gastroenterology

## 2021-08-07 NOTE — Telephone Encounter (Signed)
Inbound call from patient, requesting results from procedure done on 12/7. Please advise.

## 2021-08-07 NOTE — Telephone Encounter (Signed)
Patient requesting pathology results.  Please review and advise.  I do not see a letter in the chart.

## 2021-08-08 NOTE — Telephone Encounter (Signed)
Sheri, please call her with the path results. See Colon and EGD reports. Path letter not needed  2 benign, precancerous colon polyps.  Colon recall 07/2028  Duodenum - normal Gastric antrum - chronic gastritis with reactive changes Gastric body/fundus -  chr gastritis, atrophy, intestinal metaplasia (IM) c/w autoimmune chronic atrophic gastritis  The IM is not incomplete and not extensive which is good news. Continue with EGD recalls with the next in 07/2024 for repeat gastric biopsies

## 2021-08-08 NOTE — Telephone Encounter (Signed)
Patient notified  Follow up arranged for 09/24/21

## 2021-08-28 ENCOUNTER — Other Ambulatory Visit: Payer: Self-pay | Admitting: Family Medicine

## 2021-08-28 DIAGNOSIS — Z09 Encounter for follow-up examination after completed treatment for conditions other than malignant neoplasm: Secondary | ICD-10-CM

## 2021-09-08 ENCOUNTER — Other Ambulatory Visit: Payer: Self-pay

## 2021-09-08 ENCOUNTER — Ambulatory Visit
Admission: RE | Admit: 2021-09-08 | Discharge: 2021-09-08 | Disposition: A | Discharge status: Home / Self Care | Payer: 59 | Source: Ambulatory Visit | Attending: Family Medicine | Admitting: Family Medicine

## 2021-09-08 DIAGNOSIS — Z09 Encounter for follow-up examination after completed treatment for conditions other than malignant neoplasm: Secondary | ICD-10-CM

## 2021-09-08 MED ORDER — GADOBUTROL 1 MMOL/ML IV SOLN
7.0000 mL | Freq: Once | INTRAVENOUS | Status: AC | PRN
  Administered 2021-09-08: 7 mL via INTRAVENOUS

## 2021-09-24 ENCOUNTER — Other Ambulatory Visit (INDEPENDENT_AMBULATORY_CARE_PROVIDER_SITE_OTHER): Payer: 59

## 2021-09-24 ENCOUNTER — Ambulatory Visit: Payer: 59 | Admitting: Gastroenterology

## 2021-09-24 ENCOUNTER — Encounter: Payer: Self-pay | Admitting: Gastroenterology

## 2021-09-24 VITALS — BP 136/80 | HR 80 | Ht 65.0 in | Wt 158.1 lb

## 2021-09-24 DIAGNOSIS — K219 Gastro-esophageal reflux disease without esophagitis: Secondary | ICD-10-CM

## 2021-09-24 DIAGNOSIS — K31A0 Gastric intestinal metaplasia, unspecified: Secondary | ICD-10-CM

## 2021-09-24 DIAGNOSIS — D509 Iron deficiency anemia, unspecified: Secondary | ICD-10-CM

## 2021-09-24 LAB — CBC WITH DIFFERENTIAL/PLATELET
Basophils Absolute: 0.1 10*3/uL (ref 0.0–0.1)
Basophils Relative: 1 % (ref 0.0–3.0)
Eosinophils Absolute: 0.6 10*3/uL (ref 0.0–0.7)
Eosinophils Relative: 5.9 % — ABNORMAL HIGH (ref 0.0–5.0)
HCT: 46 % (ref 36.0–46.0)
Hemoglobin: 15.1 g/dL — ABNORMAL HIGH (ref 12.0–15.0)
Lymphocytes Relative: 14.6 % (ref 12.0–46.0)
Lymphs Abs: 1.4 10*3/uL (ref 0.7–4.0)
MCHC: 32.8 g/dL (ref 30.0–36.0)
MCV: 91.5 fl (ref 78.0–100.0)
Monocytes Absolute: 0.6 10*3/uL (ref 0.1–1.0)
Monocytes Relative: 6.1 % (ref 3.0–12.0)
Neutro Abs: 7.1 10*3/uL (ref 1.4–7.7)
Neutrophils Relative %: 72.4 % (ref 43.0–77.0)
Platelets: 262 10*3/uL (ref 150.0–400.0)
RBC: 5.03 Mil/uL (ref 3.87–5.11)
RDW: 14.1 % (ref 11.5–15.5)
WBC: 9.8 10*3/uL (ref 4.0–10.5)

## 2021-09-24 LAB — IBC + FERRITIN
Ferritin: 23.2 ng/mL (ref 10.0–291.0)
Iron: 102 ug/dL (ref 42–145)
Saturation Ratios: 23.7 % (ref 20.0–50.0)
TIBC: 431.2 ug/dL (ref 250.0–450.0)
Transferrin: 308 mg/dL (ref 212.0–360.0)

## 2021-09-24 NOTE — Progress Notes (Signed)
° ° °  History of Present Illness: This is a 61 year old female returning for follow-up of IDA, GERD and atrophic gastritis with intestinal metaplasia.  She relates she attempted to decrease Nexium to daily from twice daily and her reflux symptoms flared after 1 to 2 weeks so she resumed twice daily dosing.  She has noted mild constipation since beginning iron.  Symptoms are improved with Colace and increased dietary fiber.    Colonoscopy 07/2021 - Two 6 to 8 mm polyps in the rectum and in the sigmoid colon, removed with a cold snare. Resected and retrieved. - Internal hemorrhoids. - The examination was otherwise normal on direct and retroflexion views.  EGD 07/2021 - Mild dilation in the entire esophagus. - Small hiatal hernia. - Gastritis. Biopsied. - Normal duodenal bulb and second portion of the duodenum. Biopsied.  1. Surgical [P], colon, rectum and sigmoid, polyp (2) - TUBULAR ADENOMA WITHOUT HIGH-GRADE DYSPLASIA OR MALIGNANCY - HYPERPLASTIC POLYP 2. Surgical [P], 2nd portion of duodenum and distal duodenum - DUODENAL MUCOSA WITH NO SPECIFIC HISTOPATHOLOGIC CHANGES - NEGATIVE FOR INCREASED INTRAEPITHELIAL LYMPHOCYTES OR VILLOUS ARCHITECTURAL CHANGES 3. Surgical [P], gastric antrum - GASTRIC ANTRAL MUCOSA WITH MILD CHRONIC GASTRITIS AND REACTIVE CHANGES - WARTHIN STARRY STAIN IS NEGATIVE FOR HELICOBACTER PYLORI 4. Surgical [P], gastric body and fundus - ANTRAL TYPE MUCOSA SHOWING CHRONIC GASTRITIS WITH ATROPHY, INTESTINAL METAPLASIA AND MICRONODULAR ECL CELL HYPERPLASIA, CONSISTENT WITH CHRONIC ATROPHIC AUTOIMMUNE GASTRITIS. SEE NOTE - NEGATIVE FOR DYSPLASIA - WARTHIN-STARRY STAIN IS NEGATIVE FOR HELICOBACTER PYLORI   Current Medications, Allergies, Past Medical History, Past Surgical History, Family History and Social History were reviewed in [employer] record.   Physical Exam: General: Well developed, well nourished, no acute distress Head:  Normocephalic and atraumatic Eyes: Sclerae anicteric, EOMI Ears: Normal auditory acuity Mouth: Not examined, mask on during Covid-19 pandemic Lungs: Clear throughout to auscultation Heart: Regular rate and rhythm; no murmurs, rubs or bruits Abdomen: Soft, non tender and non distended. No masses, hepatosplenomegaly or hernias noted. Normal Bowel sounds Rectal:  Not done Musculoskeletal: Symmetrical with no gross deformities  Pulses:  Normal pulses noted Extremities: No clubbing, cyanosis, edema or deformities noted Neurological: Alert oriented x 4, grossly nonfocal Psychological:  Alert and cooperative. Normal mood and affect   Assessment and Recommendations:  IDA, etiology unclear.  CBC, Fe, TIBC, ferritin today.  If iron stores are adequate we will discontinue iron.  Follow-up with PCP, Dr. Sandi Mariscal. GERD.  Continue Nexium 40 mg p.o. twice daily and famotidine 40 mg at bedtime.  Tums as needed for breakthrough symptoms.  Closely follow antireflux measures. REV in 1 year. Atrophic gastritis with focal intestinal metaplasia.  We discussed management in detail and I addressed her questions to her satisfaction.  Surveillance EGD is recommended in December 2024. Personal history of adenomatous colon polyps.  Surveillance colonoscopy recommended in December 2029.

## 2021-09-24 NOTE — Patient Instructions (Signed)
Your provider has requested that you go to the basement level for lab work before leaving today. Press "B" on the elevator. The lab is located at the first door on the left as you exit the elevator.  The [facility] providers would like to encourage you to use MYCHART to communicate with providers for non-urgent requests or questions.  Due to long hold times on the telephone, sending your provider a message by MYCHART may be a faster and more efficient way to get a response.  Please allow 48 business hours for a response.  Please remember that this is for non-urgent requests.   Due to recent changes in healthcare laws, you may see the results of your imaging and laboratory studies on MyChart before your provider has had a chance to review them.  We understand that in some cases there may be results that are confusing or concerning to you. Not all laboratory results come back in the same time frame and the provider may be waiting for multiple results in order to interpret others.  Please give us 48 hours in order for your provider to thoroughly review all the results before contacting the office for clarification of your results.   Thank you for choosing me and [facility].  Malcolm T. Stark, Jr., MD., FACG  

## 2021-11-26 ENCOUNTER — Other Ambulatory Visit: Payer: Self-pay | Admitting: Family Medicine

## 2021-11-27 LAB — SARS CORONAVIRUS 2 (TAT 6-24 HRS): SARS Coronavirus 2: NEGATIVE

## 2022-01-26 ENCOUNTER — Other Ambulatory Visit: Payer: Self-pay | Admitting: Family Medicine

## 2022-01-26 DIAGNOSIS — Z1231 Encounter for screening mammogram for malignant neoplasm of breast: Secondary | ICD-10-CM

## 2022-02-13 ENCOUNTER — Ambulatory Visit
Admission: RE | Admit: 2022-02-13 | Discharge: 2022-02-13 | Disposition: A | Discharge status: Home / Self Care | Payer: 59 | Source: Ambulatory Visit | Attending: Family Medicine | Admitting: Family Medicine

## 2022-02-13 DIAGNOSIS — Z1231 Encounter for screening mammogram for malignant neoplasm of breast: Secondary | ICD-10-CM

## 2022-05-28 ENCOUNTER — Other Ambulatory Visit: Payer: Self-pay | Admitting: Family Medicine

## 2022-05-28 DIAGNOSIS — R29890 Loss of height: Secondary | ICD-10-CM

## 2022-05-28 DIAGNOSIS — Z780 Asymptomatic menopausal state: Secondary | ICD-10-CM

## 2022-09-29 ENCOUNTER — Other Ambulatory Visit: Payer: Self-pay | Admitting: Family Medicine

## 2022-09-29 DIAGNOSIS — R922 Inconclusive mammogram: Secondary | ICD-10-CM

## 2022-09-29 DIAGNOSIS — Z1239 Encounter for other screening for malignant neoplasm of breast: Secondary | ICD-10-CM

## 2022-10-24 ENCOUNTER — Other Ambulatory Visit: Payer: 59

## 2022-10-30 ENCOUNTER — Ambulatory Visit
Admission: RE | Admit: 2022-10-30 | Discharge: 2022-10-30 | Disposition: A | Discharge status: Home / Self Care | Payer: 59 | Source: Ambulatory Visit | Attending: Family Medicine | Admitting: Family Medicine

## 2022-10-30 DIAGNOSIS — Z1239 Encounter for other screening for malignant neoplasm of breast: Secondary | ICD-10-CM

## 2022-10-30 DIAGNOSIS — R922 Inconclusive mammogram: Secondary | ICD-10-CM

## 2022-10-30 MED ORDER — GADOPICLENOL 0.5 MMOL/ML IV SOLN
7.0000 mL | Freq: Once | INTRAVENOUS | Status: AC | PRN
  Administered 2022-10-30: 7 mL via INTRAVENOUS

## 2022-11-10 ENCOUNTER — Ambulatory Visit
Admission: RE | Admit: 2022-11-10 | Discharge: 2022-11-10 | Disposition: A | Discharge status: Home / Self Care | Payer: 59 | Source: Ambulatory Visit | Attending: Family Medicine | Admitting: Family Medicine

## 2022-11-10 DIAGNOSIS — Z780 Asymptomatic menopausal state: Secondary | ICD-10-CM

## 2022-11-10 DIAGNOSIS — R29890 Loss of height: Secondary | ICD-10-CM

## 2023-01-28 ENCOUNTER — Other Ambulatory Visit: Payer: Self-pay | Admitting: Family Medicine

## 2023-01-28 DIAGNOSIS — Z1231 Encounter for screening mammogram for malignant neoplasm of breast: Secondary | ICD-10-CM

## 2023-02-25 ENCOUNTER — Ambulatory Visit
Admission: RE | Admit: 2023-02-25 | Discharge: 2023-02-25 | Disposition: A | Discharge status: Home / Self Care | Payer: 59 | Source: Ambulatory Visit | Attending: Family Medicine | Admitting: Family Medicine

## 2023-02-25 DIAGNOSIS — Z1231 Encounter for screening mammogram for malignant neoplasm of breast: Secondary | ICD-10-CM

## 2023-06-09 ENCOUNTER — Encounter: Payer: Self-pay | Admitting: Gastroenterology

## 2023-06-29 ENCOUNTER — Ambulatory Visit (AMBULATORY_SURGERY_CENTER): Payer: 59

## 2023-06-29 ENCOUNTER — Encounter: Payer: Self-pay | Admitting: Gastroenterology

## 2023-06-29 VITALS — Ht 65.0 in | Wt 146.0 lb

## 2023-06-29 DIAGNOSIS — K2990 Gastroduodenitis, unspecified, without bleeding: Secondary | ICD-10-CM

## 2023-06-29 DIAGNOSIS — D509 Iron deficiency anemia, unspecified: Secondary | ICD-10-CM

## 2023-06-29 DIAGNOSIS — K2970 Gastritis, unspecified, without bleeding: Secondary | ICD-10-CM

## 2023-06-29 DIAGNOSIS — K219 Gastro-esophageal reflux disease without esophagitis: Secondary | ICD-10-CM

## 2023-06-29 NOTE — Progress Notes (Signed)

## 2023-07-19 ENCOUNTER — Encounter: Payer: Self-pay | Admitting: Gastroenterology

## 2023-07-19 ENCOUNTER — Ambulatory Visit (AMBULATORY_SURGERY_CENTER): Payer: 59 | Admitting: Gastroenterology

## 2023-07-19 VITALS — BP 117/68 | HR 80 | Temp 98.1°F | Resp 11 | Ht 65.0 in | Wt 146.0 lb

## 2023-07-19 DIAGNOSIS — K317 Polyp of stomach and duodenum: Secondary | ICD-10-CM | POA: Diagnosis not present

## 2023-07-19 DIAGNOSIS — K449 Diaphragmatic hernia without obstruction or gangrene: Secondary | ICD-10-CM

## 2023-07-19 DIAGNOSIS — K31A0 Gastric intestinal metaplasia, unspecified: Secondary | ICD-10-CM

## 2023-07-19 DIAGNOSIS — K2940 Chronic atrophic gastritis without bleeding: Secondary | ICD-10-CM

## 2023-07-19 DIAGNOSIS — K31A19 Gastric intestinal metaplasia without dysplasia, unspecified site: Secondary | ICD-10-CM

## 2023-07-19 DIAGNOSIS — K219 Gastro-esophageal reflux disease without esophagitis: Secondary | ICD-10-CM

## 2023-07-19 DIAGNOSIS — K2950 Unspecified chronic gastritis without bleeding: Secondary | ICD-10-CM

## 2023-07-19 MED ORDER — SODIUM CHLORIDE 0.9 % IV SOLN: 500.0000 mL | INTRAVENOUS | Status: DC

## 2023-07-19 NOTE — Op Note (Addendum)
[facility] Patient Name: Joan Cobb Procedure Date: 07/19/2023 9:46 AM MRN: [id number] Endoscopist: Meryl Dare , MD, [phone] Age: 62 Referring MD:  Date of Birth: 16-Jun-1961 Gender: Female Account #: [id number] Procedure:                Upper GI endoscopy Indications:              Surveillance procedure;atrophic gastritis with                            gastric intestinal metaplasia Medicines:                Monitored Anesthesia Care Procedure:                Pre-Anesthesia Assessment:                           - Prior to the procedure, a History and Physical                            was performed, and patient medications and                            allergies were reviewed. The patient's tolerance of                            previous anesthesia was also reviewed. The risks                            and benefits of the procedure and the sedation                            options and risks were discussed with the patient.                            All questions were answered, and informed consent                            was obtained. Prior Anticoagulants: The patient has                            taken no anticoagulant or antiplatelet agents. ASA                            Grade Assessment: II - A patient with mild systemic                            disease. After reviewing the risks and benefits,                            the patient was deemed in satisfactory condition to                            undergo the procedure.  After obtaining informed consent, the endoscope was                            passed under direct vision. Throughout the                            procedure, the patient's blood pressure, pulse, and                            oxygen saturations were monitored continuously. The                            GIF [id number] #[id number] was introduced through the                            mouth, and advanced to the  second part of duodenum.                            The upper GI endoscopy was accomplished without                            difficulty. The patient tolerated the procedure                            well. Scope In: Scope Out: Findings:                 The lumen of the proximal esophagus and mid                            esophagus was mildly dilated.                           The exam of the esophagus was otherwise normal.                           A small hiatal hernia was present.                           The gastroesophageal flap valve was visualized                            endoscopically and classified as Hill Grade III                            (minimal fold, loose to endoscope, hiatal hernia                            likely).                           Diffuse mild inflammation characterized by erythema                            and granularity was found in the gastric fundus and  in the gastric body. Biopsies were taken with a                            cold forceps for histology.                           Diffuse atrophic mucosa was found in the gastric                            fundus and in the gastric body. Biopsies were taken                            with a cold forceps for histology.                           Seven 4 to 6 mm sessile polyps with no bleeding and                            no stigmata of recent bleeding were found in the                            gastric fundus (1) and in the gastric body (6). The                            polyps were removed with a cold snare. Resection                            and retrieval were complete.                           The exam of the stomach was otherwise normal.                           The duodenal bulb and second portion of the                            duodenum were normal. Complications:            No immediate complications. Estimated Blood Loss:     Estimated blood loss was  minimal. Impression:               - Dilation in the proximal esophagus and in the mid                            esophagus.                           - Small hiatal hernia.                           - Gastroesophageal flap valve classified as Hill                            Grade III (minimal fold, loose to endoscope, hiatal  hernia likely).                           - Gastritis. Biopsied.                           - Gastric mucosal atrophy. Biopsied.                           - Multiple gastric polyps. Removed.                           - Normal duodenal bulb and second portion of the                            duodenum. Recommendation:           - Patient has a contact number available for                            emergencies. The signs and symptoms of potential                            delayed complications were discussed with the                            patient. Return to normal activities tomorrow.                            Written discharge instructions were provided to the                            patient.                           - Resume previous diet.                           - Follow antireflux measures.                           - Continue present medications.                           - Await pathology results.                           - Send: antiparietal cell Ab, anti-intrinic factor                            Ab, B12, fasting serum gastrin (off omeprazole and                            famotidine for at least 1 week)                           - Repeat upper endoscopy, likely in 2 years, for  surveillance based on pathology results with Dr.                            Adela Lank. Meryl Dare, MD 07/19/2023 10:21:03 AM This report has been signed electronically.

## 2023-07-19 NOTE — Progress Notes (Signed)
Pt's states no medical or surgical changes since previsit or office visit. 

## 2023-07-19 NOTE — Progress Notes (Signed)
Called to room to assist during endoscopic procedure.  Patient ID and intended procedure confirmed with present staff. Received instructions for my participation in the procedure from the performing physician.  

## 2023-07-19 NOTE — Progress Notes (Signed)
Report to PACU, RN, vss, BBS= Clear.  

## 2023-07-19 NOTE — Patient Instructions (Addendum)
Recommendation:           - Patient has a contact number available for                            emergencies. The signs and symptoms of potential                            delayed complications were discussed with the                            patient. Return to normal activities tomorrow.                            Written discharge instructions were provided to the                            patient.                           - Resume previous diet.                           - Continue present medications.                           - Await pathology results.                           - Send: antiparietal cell Ab, anti-intrinic factor                            Ab, B12, fasting serum gastrin (off famotidine for                            1 week)                           - Repeat upper endoscopy, likely in 2 years, for                            surveillance based on pathology results with Dr.                            Adela Lank.  Dr Russella Dar and patient discussed having labwork drawn after the holidays and pt will call then to schedule the labwork.   YOU HAD AN ENDOSCOPIC PROCEDURE TODAY AT THE [facility]:   Refer to the procedure report that was given to you for any specific questions about what was found during the examination.  If the procedure report does not answer your questions, please call your gastroenterologist to clarify.  If you requested that your care partner not be given the details of your procedure findings, then the procedure report has been included in a sealed envelope for you to review at your convenience later.  YOU SHOULD EXPECT: Some feelings of bloating in the abdomen. Passage of more gas than usual.  Walking can help get rid of the air  that was put into your GI tract during the procedure and reduce the bloating. If you had a lower endoscopy (such as a colonoscopy or flexible sigmoidoscopy) you may notice spotting of blood in your stool or on the toilet  paper. If you underwent a bowel prep for your procedure, you may not have a normal bowel movement for a few days.  Please Note:  You might notice some irritation and congestion in your nose or some drainage.  This is from the oxygen used during your procedure.  There is no need for concern and it should clear up in a day or so.  SYMPTOMS TO REPORT IMMEDIATELY:  Following upper endoscopy (EGD)  Vomiting of blood or coffee ground material  New chest pain or pain under the shoulder blades  Painful or persistently difficult swallowing  New shortness of breath  Fever of 100F or higher  Black, tarry-looking stools  For urgent or emergent issues, a gastroenterologist can be reached at any hour by calling [phone]. Do not use MyChart messaging for urgent concerns.    DIET:  We do recommend a small meal at first, but then you may proceed to your regular diet.  Drink plenty of fluids but you should avoid alcoholic beverages for 24 hours.  ACTIVITY:  You should plan to take it easy for the rest of today and you should NOT DRIVE or use heavy machinery until tomorrow (because of the sedation medicines used during the test).    FOLLOW UP: Our staff will call the number listed on your records the next business day following your procedure.  We will call around 7:15- 8:00 am to check on you and address any questions or concerns that you may have regarding the information given to you following your procedure. If we do not reach you, we will leave a message.     If any biopsies were taken you will be contacted by phone or by letter within the next 1-3 weeks.  Please call us at [phone] if you have not heard about the biopsies in 3 weeks.    SIGNATURES/CONFIDENTIALITY: You and/or your care partner have signed paperwork which will be entered into your electronic medical record.  These signatures attest to the fact that that the information above on your After Visit Summary has been reviewed  and is understood.  Full responsibility of the confidentiality of this discharge information lies with you and/or your care-partner.

## 2023-07-19 NOTE — Progress Notes (Signed)
History & Physical  Primary Care Physician:  Mosetta Putt, MD Primary Gastroenterologist: Claudette Head, MD  Impression / Plan:  Atrophic gastritis with intestinal metaplasia for EGD.  CHIEF COMPLAINT: Atrophic gastritis with intestinal metaplasia  HPI: Joan Cobb is a 62 y.o. female with atrophic gastritis with intestinal metaplasia for EGD.    Past Medical History:  Diagnosis Date   Adenomatous colon polyp 05/2007   Allergic rhinitis    Allergy    Anxiety    Asthma    with allergies   Breast mass 10/13/2016   Cataract    Complication of anesthesia    states she is hyperactive post op with every past surgery   Gastric polyps    GERD (gastroesophageal reflux disease)    Hyperlipidemia    Iron deficiency anemia    prior to hysterectomy   Retinal tear of left eye    Vitamin B12 deficiency     Past Surgical History:  Procedure Laterality Date   ABDOMINAL HYSTERECTOMY     BILATERAL SALPINGECTOMY Bilateral 12/22/2013   Procedure: BILATERAL SALPINGECTOMY;  Surgeon: Tresa Endo A. Ernestina Penna, MD;  Location: [facility];  Service: Gynecology;  Laterality: Bilateral;   BREAST BIOPSY     left   BREAST CYST ASPIRATION  march and june 2015   multiple cyst aspirations in office procedure    BREAST EXCISIONAL BIOPSY Right 2016   BREAST EXCISIONAL BIOPSY Left    BREAST LUMPECTOMY WITH RADIOACTIVE SEED LOCALIZATION Right 02/20/2015   Procedure: RIGHT BREAST LUMPECTOMY WITH RADIOACTIVE SEED LOCALIZATION;  Surgeon: Claud Kelp, MD;  Location: [facility];  Service: General;  Laterality: Right;   COLONOSCOPY     CYSTOCELE REPAIR     Left hand surgery     POLYPECTOMY     RECTOCELE REPAIR     RETINAL TEAR REPAIR CRYOTHERAPY     ROBOTIC ASSISTED TOTAL HYSTERECTOMY N/A 12/22/2013   Procedure: ROBOTIC ASSISTED TOTAL HYSTERECTOMY With Pelvic Washings;  Surgeon: Alphonsus Sias. Ernestina Penna, MD;  Location: [facility];  Service: Gynecology;  Laterality: N/A;   TOE SURGERY      left   TONSILLECTOMY     UPPER GASTROINTESTINAL ENDOSCOPY     vaginal deliveries  1990, 1995   WISDOM TOOTH EXTRACTION Bilateral     Prior to Admission medications   Medication Sig Start Date End Date Taking? Authorizing Provider  acetaminophen (TYLENOL) 500 MG tablet Take 1,000 mg by mouth as needed.   Yes [provider]  ADVAIR DISKUS 250-50 MCG/ACT AEPB Inhale 1 puff into the lungs 2 (two) times daily. 05/15/21  Yes [provider]  albuterol (PROVENTIL HFA;VENTOLIN HFA) 108 (90 BASE) MCG/ACT inhaler Inhale 2 puffs into the lungs every 6 (six) hours as needed for wheezing. With allergies   Yes [provider]  AMBULATORY NON FORMULARY MEDICATION Hormone pellets 1 injection every 4 months   Yes [provider]  CALCIUM CITRATE PO Take 1,000 mg by mouth 2 (two) times daily.   Yes [provider]  carboxymethylcellulose (REFRESH PLUS) 0.5 % SOLN 1 drop 3 (three) times daily as needed.   Yes [provider]  cholecalciferol (VITAMIN D3) 25 MCG (1000 UNIT) tablet Take by mouth. 01/05/18  Yes [provider]  ciprofloxacin (CIPRO) 250 MG tablet Take 250 mg by mouth as needed. 05/12/21  Yes [provider]  Collagen-Vitamin C-Biotin (COLLAGEN PO) Take by mouth.   Yes [provider]  Cyanocobalamin (VITAMIN B-12 CR PO) Take 2 tablets by mouth daily.  Yes [provider]  docusate sodium (COLACE) 100 MG capsule Take 200 mg by mouth at bedtime.   Yes [provider]  ezetimibe (ZETIA) 10 MG tablet    Yes [provider]  famotidine (PEPCID) 40 MG tablet Take 40 mg by mouth at bedtime.   Yes [provider]  fluticasone (FLONASE) 50 MCG/ACT nasal spray Place 1-2 sprays into both nostrils daily as needed for allergies or rhinitis.   Yes [provider]  OMEPRAZOLE PO    Yes [provider]  Probiotic Product (FLORAJEN DIGESTION PO) Take 1 capsule by mouth every  morning.   Yes [provider]  progesterone (PROMETRIUM) 200 MG capsule Take 200 mg by mouth daily.   Yes [provider]  rosuvastatin (CRESTOR) 40 MG tablet 40 mg. 07/16/23  Yes [provider]  ALPRAZolam (XANAX) 0.5 MG tablet Take 0.5 mg by mouth at bedtime as needed for anxiety. Patient not taking: Reported on 06/29/2023    [provider]  celecoxib (CELEBREX) 200 MG capsule Take by mouth 2 (two) times daily. 06/15/23   [provider]  cyclobenzaprine (FLEXERIL) 10 MG tablet Take 10 mg by mouth at bedtime as needed. 04/29/21   [provider]  ketotifen (ZADITOR) 0.025 % ophthalmic solution 1 drop 2 (two) times daily. Patient not taking: Reported on 06/29/2023    [provider]  levocetirizine (XYZAL) 5 MG tablet Take 5 mg by mouth every evening. Patient not taking: Reported on 07/19/2023    [provider]  LORazepam (ATIVAN) 0.5 MG tablet Take 0.5 mg by mouth every 8 (eight) hours.    [provider]  methocarbamol (ROBAXIN) 500 MG tablet Take 500 mg by mouth 3 (three) times daily. 06/15/23   [provider]  olopatadine (PATANOL) 0.1 % ophthalmic solution olopatadine 0.1 % eye drops Patient not taking: Reported on 06/29/2023    [provider]  traMADol (ULTRAM) 50 MG tablet Take 1 tablet every 4-6 hours by oral route.    [provider]  triamcinolone cream (KENALOG) 0.1 % triamcinolone acetonide 0.1 % topical cream    [provider]    Current Outpatient Medications  Medication Sig Dispense Refill   acetaminophen (TYLENOL) 500 MG tablet Take 1,000 mg by mouth as needed.     ADVAIR DISKUS 250-50 MCG/ACT AEPB Inhale 1 puff into the lungs 2 (two) times daily.     albuterol (PROVENTIL HFA;VENTOLIN HFA) 108 (90 BASE) MCG/ACT inhaler Inhale 2 puffs into the lungs every 6 (six) hours as needed for wheezing. With allergies     AMBULATORY NON FORMULARY MEDICATION Hormone  pellets 1 injection every 4 months     CALCIUM CITRATE PO Take 1,000 mg by mouth 2 (two) times daily.     carboxymethylcellulose (REFRESH PLUS) 0.5 % SOLN 1 drop 3 (three) times daily as needed.     cholecalciferol (VITAMIN D3) 25 MCG (1000 UNIT) tablet Take by mouth.     ciprofloxacin (CIPRO) 250 MG tablet Take 250 mg by mouth as needed.     Collagen-Vitamin C-Biotin (COLLAGEN PO) Take by mouth.     Cyanocobalamin (VITAMIN B-12 CR PO) Take 2 tablets by mouth daily.     docusate sodium (COLACE) 100 MG capsule Take 200 mg by mouth at bedtime.     ezetimibe (ZETIA) 10 MG tablet      famotidine (PEPCID) 40 MG tablet Take 40 mg by mouth at bedtime.     fluticasone (FLONASE) 50 MCG/ACT nasal  spray Place 1-2 sprays into both nostrils daily as needed for allergies or rhinitis.     OMEPRAZOLE PO      Probiotic Product (FLORAJEN DIGESTION PO) Take 1 capsule by mouth every morning.     progesterone (PROMETRIUM) 200 MG capsule Take 200 mg by mouth daily.     rosuvastatin (CRESTOR) 40 MG tablet 40 mg.     ALPRAZolam (XANAX) 0.5 MG tablet Take 0.5 mg by mouth at bedtime as needed for anxiety. (Patient not taking: Reported on 06/29/2023)     celecoxib (CELEBREX) 200 MG capsule Take by mouth 2 (two) times daily.     cyclobenzaprine (FLEXERIL) 10 MG tablet Take 10 mg by mouth at bedtime as needed.     ketotifen (ZADITOR) 0.025 % ophthalmic solution 1 drop 2 (two) times daily. (Patient not taking: Reported on 06/29/2023)     levocetirizine (XYZAL) 5 MG tablet Take 5 mg by mouth every evening. (Patient not taking: Reported on 07/19/2023)     LORazepam (ATIVAN) 0.5 MG tablet Take 0.5 mg by mouth every 8 (eight) hours.     methocarbamol (ROBAXIN) 500 MG tablet Take 500 mg by mouth 3 (three) times daily.     olopatadine (PATANOL) 0.1 % ophthalmic solution olopatadine 0.1 % eye drops (Patient not taking: Reported on 06/29/2023)     traMADol (ULTRAM) 50 MG tablet Take 1 tablet every 4-6 hours by oral route.      triamcinolone cream (KENALOG) 0.1 % triamcinolone acetonide 0.1 % topical cream     Current Facility-Administered Medications  Medication Dose Route Frequency Provider Last Rate Last Admin   0.9 %  sodium chloride infusion  500 mL Intravenous Continuous Meryl Dare, MD        Allergies as of 07/19/2023 - Review Complete 07/19/2023  Allergen Reaction Noted   Hydrocodone Other (See Comments) 06/29/2023   Other  11/20/2016   Oxycodone Nausea Only 06/29/2023   Percocet [oxycodone-acetaminophen] Other (See Comments) 08/24/2011   Wound dressing adhesive Other (See Comments) 06/29/2023   Tape Rash 03/28/2018    Family History  Problem Relation Age of Onset   Colon polyps Mother    Heart disease Father    Colon polyps Sister    Heart disease Sister    Esophageal cancer Neg Hx    Rectal cancer Neg Hx    Stomach cancer Neg Hx    Breast cancer Neg Hx    Colon cancer Neg Hx     Social History   Socioeconomic History   Marital status: Married    Spouse name: Not on file   Number of children: 2   Years of education: Not on file   Highest education level: Not on file  Occupational History    Employer: [employer]  Tobacco Use   Smoking status: Never   Smokeless tobacco: Never  Vaping Use   Vaping status: Never Used  Substance and Sexual Activity   Alcohol use: Yes    Alcohol/week: 2.0 - 3.0 standard drinks of alcohol    Types: 2 - 3 Glasses of wine per week    Comment: 1 glass of wine per day   Drug use: No   Sexual activity: Yes    Birth control/protection: Surgical    Comment: husband had vasecctomy  Other Topics Concern   Not on file  Social History Narrative   Not on file   Social Determinants of Health   Financial Resource Strain: Not on file  Food Insecurity: Not on file  Transportation Needs: Not on file  Physical Activity: Not on file  Stress: Not on file  Social Connections: Not on file  Intimate Partner Violence: Not on file    Review of  Systems:  All systems reviewed were negative except where noted in HPI.   Physical Exam:  General:  Alert, well-developed, in NAD Head:  Normocephalic and atraumatic. Eyes:  Sclera clear, no icterus.   Conjunctiva pink. Ears:  Normal auditory acuity. Mouth:  No deformity or lesions.  Neck:  Supple; no masses. Lungs:  Clear throughout to auscultation.   No wheezes, crackles, or rhonchi.  Heart:  Regular rate and rhythm; no murmurs. Abdomen:  Soft, nondistended, nontender. No masses, hepatomegaly. No palpable masses.  Normal bowel sounds.    Rectal:  Deferred   Msk:  Symmetrical without gross deformities. Extremities:  Without edema. Neurologic:  Alert and  oriented x 4; grossly normal neurologically. Skin:  Intact without significant lesions or rashes. Psych:  Alert and cooperative. Normal mood and affect.   Venita Lick. Russella Dar  07/19/2023, 9:42 AM See Loretha Stapler, [facility], to contact our on call provider

## 2023-07-20 ENCOUNTER — Telehealth: Payer: Self-pay

## 2023-07-20 NOTE — Telephone Encounter (Signed)
Patient called back and stated that they are doing great from their procedure. Please advise.

## 2023-07-20 NOTE — Telephone Encounter (Signed)
Follow up call to pt, no answer. 

## 2023-07-21 LAB — SURGICAL PATHOLOGY

## 2023-07-27 ENCOUNTER — Telehealth: Payer: Self-pay

## 2023-07-27 NOTE — Telephone Encounter (Signed)
Called patient to inform her that Dr. Russella Dar following her recent EGD wanted her to have labs drawn which include antiparietal cell Ab, anti-intrinsic factor Ab, B12, fasting serum gastrin (off omeprazole and famotidine for at least 1 week). Patient states she discussed with Dr. Russella Dar possibly doing the labs in January since she has to be off of her PPI. Informed patient that would be fine but Dr. Russella Dar would not be the ordering doctor since he is retiring the end of this year. Patient verbalized understanding and wants Dr. Adela Lank to be her new GI physician and wants him to order the tests in January. Patient states she will call back in January to have [place] put in the orders.

## 2023-08-05 ENCOUNTER — Encounter: Payer: Self-pay | Admitting: Gastroenterology

## 2023-08-13 ENCOUNTER — Other Ambulatory Visit: Payer: Self-pay

## 2023-08-13 DIAGNOSIS — K31A0 Gastric intestinal metaplasia, unspecified: Secondary | ICD-10-CM

## 2023-08-13 DIAGNOSIS — K219 Gastro-esophageal reflux disease without esophagitis: Secondary | ICD-10-CM

## 2023-08-13 DIAGNOSIS — K2940 Chronic atrophic gastritis without bleeding: Secondary | ICD-10-CM

## 2023-08-13 NOTE — Telephone Encounter (Signed)
Sure, that's fine. Thanks!

## 2023-08-13 NOTE — Telephone Encounter (Signed)
 Dr. Adela Lank,  Can I put these lab orders under you since patient is requesting you be her doctor moving forward?

## 2023-08-13 NOTE — Telephone Encounter (Signed)
 Patient is calling back for lab orders.

## 2023-08-13 NOTE — Telephone Encounter (Addendum)
 Patient states she will come to our lab the week of 08/23/23 after she has stopped her omeprazole x 1 week. Informed patient I will put the labs in for that week and to come by at her convenience.

## 2023-08-26 ENCOUNTER — Other Ambulatory Visit (INDEPENDENT_AMBULATORY_CARE_PROVIDER_SITE_OTHER): Payer: 59

## 2023-08-26 DIAGNOSIS — K31A0 Gastric intestinal metaplasia, unspecified: Secondary | ICD-10-CM

## 2023-08-26 DIAGNOSIS — K2940 Chronic atrophic gastritis without bleeding: Secondary | ICD-10-CM

## 2023-08-26 DIAGNOSIS — K219 Gastro-esophageal reflux disease without esophagitis: Secondary | ICD-10-CM

## 2023-08-26 LAB — VITAMIN B12: Vitamin B-12: 740 pg/mL (ref 211–911)

## 2023-08-31 LAB — INTRINSIC FACTOR ANTIBODIES: Intrinsic Factor: POSITIVE — AB

## 2023-08-31 LAB — GASTRIN: Gastrin: 1926 pg/mL — ABNORMAL HIGH (ref <=100)

## 2023-09-01 ENCOUNTER — Telehealth: Payer: Self-pay | Admitting: Gastroenterology

## 2023-09-01 NOTE — Telephone Encounter (Signed)
PT is returning call to discuss some medications she forgot to mention. Please advise.

## 2023-09-01 NOTE — Telephone Encounter (Signed)
I think she can continue both for now until I speak with her, but that is part of the reason I would like to see her - to discuss long term plan and options regarding her medications. Over time I would like to reduce her dose of medication if possible and can discuss more with her at that time.

## 2023-09-01 NOTE — Telephone Encounter (Signed)
Returned call to patient. Pt states that she wanted to mention that she has been taking Esomeprazole 40 mg BID and Famotidine 40 mg for years. She is wondering if medication regimen needs to be altered due to elevated gastrin level despite current regimen. Or should she continue current prescriptions? Please advise, thanks.

## 2023-09-01 NOTE — Telephone Encounter (Signed)
Called and reviewed recommendations with patient. Pt verbalized understanding and had no concerns.

## 2023-10-15 ENCOUNTER — Encounter: Payer: Self-pay | Admitting: Gastroenterology

## 2023-10-15 ENCOUNTER — Other Ambulatory Visit: Payer: Self-pay

## 2023-10-15 ENCOUNTER — Ambulatory Visit: Payer: 59 | Admitting: Gastroenterology

## 2023-10-15 VITALS — BP 118/72 | HR 81 | Ht 65.0 in | Wt 143.0 lb

## 2023-10-15 DIAGNOSIS — E164 Increased secretion of gastrin: Secondary | ICD-10-CM

## 2023-10-15 DIAGNOSIS — R208 Other disturbances of skin sensation: Secondary | ICD-10-CM

## 2023-10-15 DIAGNOSIS — Z79899 Other long term (current) drug therapy: Secondary | ICD-10-CM

## 2023-10-15 DIAGNOSIS — K2940 Chronic atrophic gastritis without bleeding: Secondary | ICD-10-CM | POA: Diagnosis not present

## 2023-10-15 DIAGNOSIS — K219 Gastro-esophageal reflux disease without esophagitis: Secondary | ICD-10-CM | POA: Diagnosis not present

## 2023-10-15 NOTE — Progress Notes (Signed)
 Order placed for CT abdomen pelvis for elevated gastrin level per Dr. Adela Lank who called patient and advised her.  Msg to schedulers to call patient to schedule

## 2023-10-15 NOTE — Patient Instructions (Signed)
 You have been scheduled for an Esophageal Manometry and 24 Hour pH study at [facility] on Wednesday, 7/23 at 8:30 am. Please arrive 30 minutes prior to your procedure for registration. You will need to go to outpatient registration (1st floor of the hospital) first. Make certain to bring your insurance cards as well as a complete list of medications.  Please remember the following:  1) Do not take any muscle relaxants, xanax (alprazolam) or ativan for 1 day prior to your test as well as the day of the test.  2) Nothing to eat or drink after 12:00 midnight on the night before your test.  3) Hold all diabetic medications/insulin the morning of the test. You may eat and take your medications after the test.  4) For 7 days prior to your test, do not take: Reglan, Tagamet, Zantac, Phenergan, Axid or Pepcid.  5) You MAY use an antacid such as Rolaids or Tums up to 12 hours prior to your test.  6) Continue taking omeprazole.    ------------------------------------------------------------------------------------------- ABOUT ESOPHAGEAL MANOMETRY Esophageal manometry (muh-NOM-uh-tree) is a test that gauges how well your esophagus works. Your esophagus is the long, muscular tube that connects your throat to your stomach. Esophageal manometry measures the rhythmic muscle contractions (peristalsis) that occur in your esophagus when you swallow. Esophageal manometry also measures the coordination and force exerted by the muscles of your esophagus.  During esophageal manometry, a thin, flexible tube (catheter) that contains sensors is passed through your nose, down your esophagus and into your stomach. Esophageal manometry can be helpful in diagnosing some mostly uncommon disorders that affect your esophagus.  Why it's done Esophageal manometry is used to evaluate the movement (motility) of food through the esophagus and into the stomach. The test measures how well the circular bands of  muscle (sphincters) at the top and bottom of your esophagus open and close, as well as the pressure, strength and pattern of the wave of esophageal muscle contractions that moves food along.  What you can expect Esophageal manometry is an outpatient procedure done without sedation. Most people tolerate it well. You may be asked to change into a hospital gown before the test starts.  During esophageal manometry  While you are sitting up, a member of your health care team sprays your throat with a numbing medication or puts numbing gel in your nose or both.  A catheter is guided through your nose into your esophagus. The catheter may be sheathed in a water-filled sleeve. It doesn't interfere with your breathing. However, your eyes may water, and you may gag. You may have a slight nosebleed from irritation.  After the catheter is in place, you may be asked to lie on your back on an exam table, or you may be asked to remain seated.  You then swallow small sips of water. As you do, a computer connected to the catheter records the pressure, strength and pattern of your esophageal muscle contractions.  During the test, you'll be asked to breathe slowly and smoothly, remain as still as possible, and swallow only when you're asked to do so.  A member of your health care team may move the catheter down into your stomach while the catheter continues its measurements.  The catheter then is slowly withdrawn.  This test typically takes 30-45 minutes to complete.  ---------------------------------------------------------------------------------------------- ABOUT 24 HOUR PH PROBE An esophageal pH test measures and records the pH in your esophagus to determine if you have gastroesophageal reflux disease (  GERD). The test can also be done to determine the effectiveness of medications or surgical treatment for GERD. What is esophageal reflux? Esophageal reflux is a condition in which stomach acid refluxes or moves  back into the esophagus (the "food pipe" leading from the mouth to the stomach). How does the esophageal pH test work? A thin, small tube with an acid sensing device on the tip is gently passed through your nose, down the esophagus ("food tube"), and positioned about 2 inches above the lower esophageal sphincter. The tube is secured to the side of your face with clear tape. The end of the tube exiting from your nose is attached to a portable recorder that is worn on your belt or over your shoulder. The recorder has several buttons on it that you will press to mark certain events. A nurse will review the monitoring instructions with you. Once the test has begun, what do I need to know and do? Activity: Follow your usual daily routine. Do not reduce or change your activities during the monitoring period. Doing so can make the monitoring results less useful.  Note: do not take a tub bath or shower; the equipment can't get wet.  Eating: Eat your regular meals at the usual times. If you do not eat during the monitoring period, your stomach will not produce acid as usual, and the test results will not be accurate. Eat at least 2 meals a day. Eat foods that tend to increase your symptoms (without making yourself miserable). Avoid snacking. Do not suck on hard candy or lozenges and do not chew gum during the monitoring period.  Lying down: Remain upright throughout the day. Do not lie down until you go to bed (unless napping or lying down during the day is part of your daily routine).  Medications: Continue to follow your doctor's advice regarding medications to avoid during the monitoring period.  Recording symptoms: Press the appropriate button on your recorder when symptoms occur (as discussed with the nurse).  Recording events: Record the time you start and stop eating and drinking (anything other than plain water). Record the time you lie down (even if just resting) and when you get back up. The nurse will  explain this.  Unusual symptoms or side effects. If you think you may be experiencing any unusual symptoms or side effects, call your doctor.  You will return the next day to have the tube removed. The information on the recorder will be downloaded to a computer and the results will be analyzed.  After completion of the study Resume your normal diet and medications. Lozenges or hard candy may help ease any sore throat caused by the tube.   It will take at least 2 weeks to receive the results of this test from your physician.  You will be due for a recall upper endoscopy in 07/2025. We will send you a reminder in the mail when it gets closer to that time.  Continue Omeprazole.  Thank you for entrusting me with your care and for choosing [facility], Dr. Ileene Patrick   _______________________________________________________  If your blood pressure at your visit was 140/90 or greater, please contact your primary care physician to follow up on this.  _______________________________________________________  If you are age 76 or older, your body mass index should be between 23-30. Your Body mass index is 23.8 kg/m. If this is out of the aforementioned range listed, please consider follow up with your Primary Care Provider.  If you are  age 78 or younger, your body mass index should be between 19-25. Your Body mass index is 23.8 kg/m. If this is out of the aformentioned range listed, please consider follow up with your Primary Care Provider.   ________________________________________________________  The [facility] providers would like to encourage you to use [facility] to communicate with providers for non-urgent requests or questions.  Due to long hold times on the telephone, sending your provider a message by [facility] may be a faster and more efficient way to get a response.  Please allow 48 business hours for a response.  Please remember that this is for non-urgent requests.   _______________________________________________________    .

## 2023-10-15 NOTE — Progress Notes (Signed)
 HPI :  63 year old female here for a follow-up visit.  Recall she has a history of atrophic/autoimmune gastritis with gastric intestinal metaplasia, GERD she has been historically followed by Dr. Russella Dar for these issues, since his retirement this is my first time meeting her.  She has had multiple endoscopies over the years dating back to 2013.  She has had gastric intestinal metaplasia, atrophic/autoimmune gastritis known for years and has been followed with surveillance endoscopy.  Most recently she had an endoscopy with Dr. Russella Dar in December 2024, she has some benign hyperplastic polyps removed, she had scattered areas of gastritis with gastric intestinal metaplasia without dysplasia.  She has a history of B12 deficiency.  He ordered some labs for her recently in January, B12 level had normalized, intrinsic factor antibody was positive.  She stopped her PPI for a few weeks and gastrin level was over 1900.  Of note she has never had abdominal imaging.  No reported gastric or duodenal ulcers that I can see in her chart  procedures.  She is here today to discuss findings.  She has had some indigestion, nonspecific intermittent nausea and "burning mouth" for years.  She has been on PPI long-term for years.  She has been on most recently Nexium 40 mg twice daily as well as Pepcid 40 mg nightly.  She states she does not have any reflux, she does not have much of any nausea or vomiting, but she continues to have "burning mouth".  This burning sensation in her mouth tends to be during the day, does not really bother her at night, not consistent with classic waterbrash.  If anything she states it might be better at night.  She has been on PPI for years, DEXA scan was normal in 2024.  She has no CKD.  She tries to watch her diet which can also help minimize her symptoms.  She has no family history of gastric cancer.  Overall she thinks PPI has certainly helped indigestion and nausea, perhaps has not really helped  much of her oral symptoms.   Most recent workup: EGD 07/19/23: - The lumen of the proximal esophagus and mid esophagus was mildly dilated. - The exam of the esophagus was otherwise normal. - A small hiatal hernia was present. - The gastroesophageal flap valve was visualized endoscopically and classified as Hill Grade III (minimal fold, loose to endoscope, hiatal hernia likely). - Diffuse mild inflammation characterized by erythema and granularity was found in the gastric fundus and in the gastric body. Biopsies were taken with a cold forceps for histology. - Diffuse atrophic mucosa was found in the gastric fundus and in the gastric body. Biopsies were taken with a cold forceps for histology. - Seven 4 to 6 mm sessile polyps with no bleeding and no stigmata of recent bleeding were found in the gastric fundus (1) and in the gastric body (6). The polyps were removed with a cold snare. Resection and retrieval were complete. - The exam of the stomach was otherwise normal. - The duodenal bulb and second portion of the duodenum were normal.  1. Surgical [P], gastric polyps, polyp (7) :      HYPERPLASTIC POLYP WITH INTESTINAL METAPLASIA.      IMMUNOHISTOCHEMICAL STAIN FOR H. PYLORI WILL BE REPORTED IN AN ADDENDUM.      NEGATIVE FOR DYSPLASIA.       2. Surgical [P], gastric antrum :      GASTRIC ANTRAL MUCOSA WITH CHRONIC INACTIVE GASTRITIS.  IMMUNOHISTOCHEMICAL STAIN FOR H. PYLORI WILL BE REPORTED IN AN ADDENDUM.      NEGATIVE FOR INTESTINAL METAPLASIA OR DYSPLASIA.       3. Surgical [P], gastric body :      GASTRIC OXYNTIC MUCOSA WITH CHRONIC INACTIVE GASTRITIS.      IMMUNOHISTOCHEMICAL STAIN FOR H. PYLORI WILL BE REPORTED IN AN ADDENDUM.      NEGATIVE FOR INTESTINAL METAPLASIA OR DYSPLASIA.       4. Surgical [P], angularis bxs :      GASTRIC ANTRAL MUCOSA WITH CHRONIC INACTIVE GASTRITIS AND INTESTINAL METAPLASIA.      IMMUNOHISTOCHEMICAL STAIN FOR H. PYLORI WILL BE REPORTED IN AN ADDENDUM.       NEGATIVE FOR DYSPLASIA.       5. Surgical [P], fundus :      GASTRIC ANTRAL / OXYNTIC MUCOSA WITH CHRONIC INACTIVE GASTRITIS AND INTESTINAL      METAPLASIA.      IMMUNOHISTOCHEMICAL STAIN FOR H. PYLORI WILL BE REPORTED IN AN ADDENDUM.      NEGATIVE FOR DYSPLASIA.    Colonoscopy 07/2021 - Two 6 to 8 mm polyps in the rectum and in the sigmoid colon, removed with a cold snare. Resected and retrieved. - Internal hemorrhoids. - The examination was otherwise normal on direct and retroflexion views.   EGD 07/2021 - Mild dilation in the entire esophagus. - Small hiatal hernia. - Gastritis. Biopsied. - Normal duodenal bulb and second portion of the duodenum. Biopsied.   1. Surgical [P], colon, rectum and sigmoid, polyp (2) - TUBULAR ADENOMA WITHOUT HIGH-GRADE DYSPLASIA OR MALIGNANCY - HYPERPLASTIC POLYP 2. Surgical [P], 2nd portion of duodenum and distal duodenum - DUODENAL MUCOSA WITH NO SPECIFIC HISTOPATHOLOGIC CHANGES - NEGATIVE FOR INCREASED INTRAEPITHELIAL LYMPHOCYTES OR VILLOUS ARCHITECTURAL CHANGES 3. Surgical [P], gastric antrum - GASTRIC ANTRAL MUCOSA WITH MILD CHRONIC GASTRITIS AND REACTIVE CHANGES - WARTHIN STARRY STAIN IS NEGATIVE FOR HELICOBACTER PYLORI 4. Surgical [P], gastric body and fundus - ANTRAL TYPE MUCOSA SHOWING CHRONIC GASTRITIS WITH ATROPHY, INTESTINAL METAPLASIA AND MICRONODULAR ECL CELL HYPERPLASIA, CONSISTENT WITH CHRONIC ATROPHIC AUTOIMMUNE GASTRITIS. SEE NOTE - NEGATIVE FOR DYSPLASIA - WARTHIN-STARRY STAIN IS NEGATIVE FOR HELICOBACTER PYLORI          3. Surgical [P], gastric body :      GASTRIC OXYNTIC MUCOSA WITH CHRONIC INACTIVE GASTRITIS.      IMMUNOHISTOCHEMICAL STAIN FOR H. PYLORI WILL BE REPORTED IN AN ADDENDUM.  Labs 08/26/23: Intrinsic factor AB (+)  Parietal cell AB ordered but not sent Gastrin 1926 [id number]   Past Medical History:  Diagnosis Date   Adenomatous colon polyp 05/2007   Allergic rhinitis    Allergy    Anxiety    Asthma     with allergies   Autoimmune gastritis    Breast mass 10/13/2016   Cataract    Complication of anesthesia    states she is hyperactive post op with every past surgery   Gastric intestinal metaplasia    Gastric polyps    GERD (gastroesophageal reflux disease)    Hyperlipidemia    Iron deficiency anemia    prior to hysterectomy   Retinal tear of left eye    Vitamin B12 deficiency      Past Surgical History:  Procedure Laterality Date   ABDOMINAL HYSTERECTOMY     BILATERAL SALPINGECTOMY Bilateral 12/22/2013   Procedure: BILATERAL SALPINGECTOMY;  Surgeon: Tresa Endo A. Ernestina Penna, MD;  Location: [facility];  Service: Gynecology;  Laterality: Bilateral;   BREAST BIOPSY  left   BREAST CYST ASPIRATION  march and june 2015   multiple cyst aspirations in office procedure    BREAST EXCISIONAL BIOPSY Right 2016   BREAST EXCISIONAL BIOPSY Left    BREAST LUMPECTOMY WITH RADIOACTIVE SEED LOCALIZATION Right 02/20/2015   Procedure: RIGHT BREAST LUMPECTOMY WITH RADIOACTIVE SEED LOCALIZATION;  Surgeon: Claud Kelp, MD;  Location: [facility];  Service: General;  Laterality: Right;   COLONOSCOPY     CYSTOCELE REPAIR     Left hand surgery     POLYPECTOMY     RECTOCELE REPAIR     RETINAL TEAR REPAIR CRYOTHERAPY     ROBOTIC ASSISTED TOTAL HYSTERECTOMY N/A 12/22/2013   Procedure: ROBOTIC ASSISTED TOTAL HYSTERECTOMY With Pelvic Washings;  Surgeon: Alphonsus Sias. Ernestina Penna, MD;  Location: [facility];  Service: Gynecology;  Laterality: N/A;   TOE SURGERY     left   TONSILLECTOMY     UPPER GASTROINTESTINAL ENDOSCOPY     vaginal deliveries  1990, 1995   WISDOM TOOTH EXTRACTION Bilateral    Family History  Problem Relation Age of Onset   Colon polyps Mother    Heart disease Father    Colon polyps Sister    Heart disease Sister    Esophageal cancer Neg Hx    Rectal cancer Neg Hx    Stomach cancer Neg Hx    Breast cancer Neg Hx    Colon cancer Neg Hx    Social History   Tobacco Use    Smoking status: Never   Smokeless tobacco: Never  Vaping Use   Vaping status: Never Used  Substance Use Topics   Alcohol use: Yes    Alcohol/week: 2.0 - 3.0 standard drinks of alcohol    Types: 2 - 3 Glasses of wine per week    Comment: 1 glass of wine per day   Drug use: No   Current Outpatient Medications  Medication Sig Dispense Refill   acetaminophen (TYLENOL) 500 MG tablet Take 1,000 mg by mouth as needed.     ADVAIR DISKUS 250-50 MCG/ACT AEPB Inhale 1 puff into the lungs 2 (two) times daily.     albuterol (PROVENTIL HFA;VENTOLIN HFA) 108 (90 BASE) MCG/ACT inhaler Inhale 2 puffs into the lungs every 6 (six) hours as needed for wheezing. With allergies     ALPRAZolam (XANAX) 0.5 MG tablet Take 0.5 mg by mouth at bedtime as needed for anxiety.     AMBULATORY NON FORMULARY MEDICATION Hormone pellets 1 injection every 4 months     CALCIUM CITRATE PO Take 1,000 mg by mouth 2 (two) times daily.     carboxymethylcellulose (REFRESH PLUS) 0.5 % SOLN 1 drop 3 (three) times daily as needed.     celecoxib (CELEBREX) 200 MG capsule Take by mouth 2 (two) times daily.     cholecalciferol (VITAMIN D3) 25 MCG (1000 UNIT) tablet Take by mouth.     ciprofloxacin (CIPRO) 250 MG tablet Take 250 mg by mouth as needed.     Collagen-Vitamin C-Biotin (COLLAGEN PO) Take by mouth.     Cyanocobalamin (VITAMIN B-12 CR PO) Take 2 tablets by mouth daily.     cyclobenzaprine (FLEXERIL) 10 MG tablet Take 10 mg by mouth at bedtime as needed.     docusate sodium (COLACE) 100 MG capsule Take 200 mg by mouth at bedtime.     ezetimibe (ZETIA) 10 MG tablet      famotidine (PEPCID) 40 MG tablet Take 40 mg by mouth at bedtime.  fluticasone (FLONASE) 50 MCG/ACT nasal spray Place 1-2 sprays into both nostrils daily as needed for allergies or rhinitis.     ketotifen (ZADITOR) 0.025 % ophthalmic solution 1 drop 2 (two) times daily.     levocetirizine (XYZAL) 5 MG tablet Take 5 mg by mouth every evening.     LORazepam  (ATIVAN) 0.5 MG tablet Take 0.5 mg by mouth every 8 (eight) hours.     methocarbamol (ROBAXIN) 500 MG tablet Take 500 mg by mouth 3 (three) times daily.     olopatadine (PATANOL) 0.1 % ophthalmic solution      OMEPRAZOLE PO      Probiotic Product (FLORAJEN DIGESTION PO) Take 1 capsule by mouth every morning.     progesterone (PROMETRIUM) 200 MG capsule Take 200 mg by mouth daily.     rosuvastatin (CRESTOR) 40 MG tablet 40 mg.     traMADol (ULTRAM) 50 MG tablet Take 1 tablet every 4-6 hours by oral route.     triamcinolone cream (KENALOG) 0.1 % triamcinolone acetonide 0.1 % topical cream     No current facility-administered medications for this visit.   Allergies  Allergen Reactions   Hydrocodone Other (See Comments)   Other     Chlorhexidine gluconate 4%   Oxycodone Nausea Only   Percocet [Oxycodone-Acetaminophen] Other (See Comments)    Hyperactive   Wound Dressing Adhesive Other (See Comments)   Tape Rash     Review of Systems: All systems reviewed and negative except where noted in HPI.   Lab Results  Component Value Date   WBC 9.8 09/24/2021   HGB 15.1 (H) 09/24/2021   HCT 46.0 09/24/2021   MCV 91.5 09/24/2021   PLT 262.0 09/24/2021    Lab Results  Component Value Date   NA 139 12/21/2013   CL 105 12/21/2013   K 4.3 12/21/2013   CO2 24 12/21/2013   BUN 12 12/21/2013   CREATININE 0.77 12/21/2013   GFRNONAA >90 12/21/2013   CALCIUM 9.2 12/21/2013   GLUCOSE 96 12/21/2013    No results found for: "ALT", "AST", "GGT", "ALKPHOS", "BILITOT"  Physical Exam: BP 118/72   Pulse 81   Ht 5\' 5"  (1.651 m)   Wt 143 lb (64.9 kg)   LMP 11/10/2013   BMI 23.80 kg/m  Constitutional: Pleasant,well-developed, female in no acute distress. Neurological: Alert and oriented to person place and time. Psychiatric: Normal mood and affect. Behavior is normal.   ASSESSMENT: 63 y.o. female here for assessment of the following  1. Gastroesophageal reflux disease, unspecified  whether esophagitis present   2. Burning sensation of mouth   3. Long-term current use of proton pump inhibitor therapy   4. Autoimmune gastritis   5. Elevated gastrin level    We reviewed her history at length today, this is my first time meeting her.  On chronic high-dose PPI for indigestion/reflux, also history of autoimmune gastritis complicated by iron and B12 deficiency.  She has had good improvement of her indigestion and nausea with PPI but continues to have burning in her oropharynx which seems a bit less likely to me to be related to reflux/waterbrash.  We discussed options to further evaluate this.  It is possible she could have nonacid reflux that could be contributing to this.  She does not have any erosive changes on her EGD.  She has had some evidence of dysmotility on prior barium study as well as frank reflux.  To clarify if reflux is causing her oropharyngeal symptoms a 24-hour pH  impedance test with manometry I think is reasonable to assess for nonacid reflux.  I discussed what this is with her.  Results of this can influence how much PPI she is on and long-term treatment.  If this study is normal on PPI then I would de-escalate her therapy and consider alternative diagnosis.  However, if she has significant nonacid reflux driving her symptoms, then could consider surgical evaluation or trial of baclofen.  Following discussion of 24-hour pH impedance test the manometry she wants to proceed.  This exam will be done on PPI.  We otherwise discussed long-term risks of chronic PPI use, she understands and for now we will continue current dosing.  Long-term, once we have results of the pH test, we will use the lowest daily dose needed to control her symptoms.  Otherwise reviewed her history of autoimmune gastritis and gastric intestinal metaplasia.  Counseled her this can increase the risk for gastric cancer, she does not have any family history of gastric cancer.  We discussed how  frequently to do this in regards to surveillance, I think a repeat endoscopy in 2-3 years is reasonable.  I did review her blood work, her gastrin level is markedly elevated.  I think this is very likely due to autoimmune gastritis, however we do not have gastric pH testing available to clarify this and gastrinoma/DES is also on the DDx.  She has been on PPI high dose at baseline.  No history of ulcers, I think again gastrinomas less likely but she has never had imaging of her abdomen.  For peace of mind I offered her a CT scan of her abdomen pelvis to ensure no evidence of gastrinoma or other pathology to be driving this given the marked elevation of the gastrin level.  Following discussion of this she did want a proceed with it.   PLAN: - schedule 24 hour pH impedance test and manometry ON nexium BID and pepcid. Assess for nonacid reflux and to detemrine long term plan. I am not convinced some of her symptoms are due to reflux - discussed long term PPI use and risks - long term want to use lowest dose needed to control symptoms. Will await pH testing first - discussed AIG - risks for gastric cancer, repeat EGD surveillance in 2 years - CT scan abdomen / pelvis given marked elevation in gastrin level   Harlin Rain, MD [facility]

## 2023-10-22 ENCOUNTER — Other Ambulatory Visit: Payer: Self-pay | Admitting: Family Medicine

## 2023-10-22 ENCOUNTER — Ambulatory Visit (HOSPITAL_COMMUNITY)
Admission: RE | Admit: 2023-10-22 | Discharge: 2023-10-22 | Disposition: A | Discharge status: Home / Self Care | Source: Ambulatory Visit | Attending: Gastroenterology | Admitting: Gastroenterology

## 2023-10-22 DIAGNOSIS — E164 Increased secretion of gastrin: Secondary | ICD-10-CM | POA: Diagnosis present

## 2023-10-22 DIAGNOSIS — R9230 Dense breasts, unspecified: Secondary | ICD-10-CM

## 2023-10-22 MED ORDER — IOHEXOL 300 MG/ML  SOLN
100.0000 mL | Freq: Once | INTRAMUSCULAR | Status: AC | PRN
  Administered 2023-10-22: 100 mL via INTRAVENOUS

## 2023-11-01 ENCOUNTER — Encounter: Payer: Self-pay | Admitting: Gastroenterology

## 2023-11-04 ENCOUNTER — Other Ambulatory Visit: Payer: Self-pay | Admitting: Family Medicine

## 2023-11-04 DIAGNOSIS — R9230 Dense breasts, unspecified: Secondary | ICD-10-CM

## 2023-11-04 DIAGNOSIS — N6489 Other specified disorders of breast: Secondary | ICD-10-CM

## 2023-12-09 SURGERY — Surgical Case
Anesthesia: *Unknown

## 2023-12-15 ENCOUNTER — Ambulatory Visit
Admission: RE | Admit: 2023-12-15 | Discharge: 2023-12-15 | Disposition: A | Discharge status: Home / Self Care | Source: Ambulatory Visit | Attending: Family Medicine | Admitting: Family Medicine

## 2023-12-15 DIAGNOSIS — N6489 Other specified disorders of breast: Secondary | ICD-10-CM

## 2023-12-15 DIAGNOSIS — R9230 Dense breasts, unspecified: Secondary | ICD-10-CM

## 2023-12-15 MED ORDER — GADOPICLENOL 0.5 MMOL/ML IV SOLN
6.0000 mL | Freq: Once | INTRAVENOUS | Status: AC | PRN
  Administered 2023-12-15: 6 mL via INTRAVENOUS

## 2023-12-16 ENCOUNTER — Telehealth: Payer: Self-pay | Admitting: Gastroenterology

## 2023-12-16 NOTE — Telephone Encounter (Signed)
 PT is calling to find out if there is any further information she needs since her manometry appointment was changed. Please advise.

## 2023-12-16 NOTE — Telephone Encounter (Signed)
 Spoke with pt and she is aware of appt and her instructions.

## 2023-12-20 ENCOUNTER — Other Ambulatory Visit (HOSPITAL_COMMUNITY): Payer: Self-pay | Admitting: Family Medicine

## 2023-12-20 DIAGNOSIS — Z8279 Family history of other congenital malformations, deformations and chromosomal abnormalities: Secondary | ICD-10-CM

## 2024-01-24 ENCOUNTER — Ambulatory Visit (HOSPITAL_COMMUNITY)
Admission: RE | Admit: 2024-01-24 | Discharge: 2024-01-24 | Disposition: A | Discharge status: Home / Self Care | Source: Ambulatory Visit | Attending: Cardiology | Admitting: Cardiology

## 2024-01-24 DIAGNOSIS — Z8279 Family history of other congenital malformations, deformations and chromosomal abnormalities: Secondary | ICD-10-CM | POA: Diagnosis not present

## 2024-01-24 DIAGNOSIS — Z136 Encounter for screening for cardiovascular disorders: Secondary | ICD-10-CM | POA: Insufficient documentation

## 2024-01-24 LAB — ECHOCARDIOGRAM COMPLETE
Area-P 1/2: 3.48 cm2
S' Lateral: 3.2 cm

## 2024-03-01 DIAGNOSIS — K219 Gastro-esophageal reflux disease without esophagitis: Secondary | ICD-10-CM | POA: Insufficient documentation

## 2024-03-08 ENCOUNTER — Encounter (HOSPITAL_COMMUNITY)
Admission: RE | Disposition: A | Discharge status: Home / Self Care | Payer: Self-pay | Source: Home / Self Care | Attending: Gastroenterology

## 2024-03-08 ENCOUNTER — Encounter (HOSPITAL_COMMUNITY): Payer: Self-pay | Admitting: Gastroenterology

## 2024-03-08 ENCOUNTER — Ambulatory Visit (HOSPITAL_COMMUNITY)
Admission: RE | Admit: 2024-03-08 | Discharge: 2024-03-08 | Disposition: A | Discharge status: Home / Self Care | Attending: Gastroenterology | Admitting: Gastroenterology

## 2024-03-08 DIAGNOSIS — R12 Heartburn: Secondary | ICD-10-CM | POA: Diagnosis present

## 2024-03-08 DIAGNOSIS — K219 Gastro-esophageal reflux disease without esophagitis: Secondary | ICD-10-CM | POA: Insufficient documentation

## 2024-03-08 DIAGNOSIS — Z79899 Other long term (current) drug therapy: Secondary | ICD-10-CM | POA: Insufficient documentation

## 2024-03-08 DIAGNOSIS — K146 Glossodynia: Secondary | ICD-10-CM | POA: Diagnosis not present

## 2024-03-08 DIAGNOSIS — K224 Dyskinesia of esophagus: Secondary | ICD-10-CM | POA: Insufficient documentation

## 2024-03-08 HISTORY — PX: 24 HOUR PH STUDY: SHX5419

## 2024-03-08 HISTORY — PX: ESOPHAGEAL MANOMETRY: SHX5429

## 2024-03-08 SURGERY — MANOMETRY, ESOPHAGUS

## 2024-03-08 MED ORDER — LIDOCAINE VISCOUS HCL 2 % MT SOLN
OROMUCOSAL | Status: AC
  Filled 2024-03-08: qty 15

## 2024-03-08 SURGICAL SUPPLY — 2 items
FACESHIELD LNG OPTICON STERILE (SAFETY) IMPLANT
GLOVE BIO SURGEON STRL SZ8 (GLOVE) ×2 IMPLANT

## 2024-03-08 NOTE — Progress Notes (Signed)
 Esophageal Manometry done per protocol. Pt tolerated well with out complication. Ph with impedance done per protocol. Pt tolerated well. Instructions given regarding the study and monitor. Pt verbalized understand and return demonstrated use of monitor. Pt will return tomorrow to have probe removed and monitor downloaded.

## 2024-03-09 ENCOUNTER — Encounter (HOSPITAL_COMMUNITY): Payer: Self-pay | Admitting: Gastroenterology

## 2024-03-24 ENCOUNTER — Ambulatory Visit: Payer: Self-pay | Admitting: Gastroenterology

## 2024-03-28 NOTE — Telephone Encounter (Signed)
 Inbound call from patient requesting a call to go over results and recommendations again. States she was not in a place to take down information. Please advise, thank you.

## 2024-03-29 NOTE — Progress Notes (Unsigned)
 HPI :  63 year old female here for a follow-up visit.  Recall she has a history of atrophic/autoimmune gastritis with gastric intestinal metaplasia, GERD she has been historically followed by Dr. Aneita for these issues, since his retirement this is my first time meeting her.   She has had multiple endoscopies over the years dating back to 2013.  She has had gastric intestinal metaplasia, atrophic/autoimmune gastritis known for years and has been followed with surveillance endoscopy.  Most recently she had an endoscopy with Dr. Aneita in December 2024, she has some benign hyperplastic polyps removed, she had scattered areas of gastritis with gastric intestinal metaplasia without dysplasia.  She has a history of B12 deficiency.  He ordered some labs for her recently in January, B12 level had normalized, intrinsic factor antibody was positive.  She stopped her PPI for a few weeks and gastrin level was over 1900.  Of note she has never had abdominal imaging.  No reported gastric or duodenal ulcers that I can see in her chart  procedures.   She is here today to discuss findings.  She has had some indigestion, nonspecific intermittent nausea and burning mouth for years.  She has been on PPI long-term for years.  She has been on most recently Nexium 40 mg twice daily as well as Pepcid 40 mg nightly.  She states she does not have any reflux, she does not have much of any nausea or vomiting, but she continues to have burning mouth.  This burning sensation in her mouth tends to be during the day, does not really bother her at night, not consistent with classic waterbrash.  If anything she states it might be better at night.  She has been on PPI for years, DEXA scan was normal in 2024.  She has no CKD.  She tries to watch her diet which can also help minimize her symptoms.  She has no family history of gastric cancer.  Overall she thinks PPI has certainly helped indigestion and nausea, perhaps has not really helped  much of her oral symptoms.     Most recent workup: EGD 07/19/23: - The lumen of the proximal esophagus and mid esophagus was mildly dilated. - The exam of the esophagus was otherwise normal. - A small hiatal hernia was present. - The gastroesophageal flap valve was visualized endoscopically and classified as Hill Grade III (minimal fold, loose to endoscope, hiatal hernia likely). - Diffuse mild inflammation characterized by erythema and granularity was found in the gastric fundus and in the gastric body. Biopsies were taken with a cold forceps for histology. - Diffuse atrophic mucosa was found in the gastric fundus and in the gastric body. Biopsies were taken with a cold forceps for histology. - Seven 4 to 6 mm sessile polyps with no bleeding and no stigmata of recent bleeding were found in the gastric fundus (1) and in the gastric body (6). The polyps were removed with a cold snare. Resection and retrieval were complete. - The exam of the stomach was otherwise normal. - The duodenal bulb and second portion of the duodenum were normal.   1. Surgical [P], gastric polyps, polyp (7) :      HYPERPLASTIC POLYP WITH INTESTINAL METAPLASIA.      IMMUNOHISTOCHEMICAL STAIN FOR H. PYLORI WILL BE REPORTED IN AN ADDENDUM.      NEGATIVE FOR DYSPLASIA.       2. Surgical [P], gastric antrum :      GASTRIC ANTRAL MUCOSA WITH CHRONIC INACTIVE GASTRITIS.  IMMUNOHISTOCHEMICAL STAIN FOR H. PYLORI WILL BE REPORTED IN AN ADDENDUM.      NEGATIVE FOR INTESTINAL METAPLASIA OR DYSPLASIA.       3. Surgical [P], gastric body :      GASTRIC OXYNTIC MUCOSA WITH CHRONIC INACTIVE GASTRITIS.      IMMUNOHISTOCHEMICAL STAIN FOR H. PYLORI WILL BE REPORTED IN AN ADDENDUM.      NEGATIVE FOR INTESTINAL METAPLASIA OR DYSPLASIA.       4. Surgical [P], angularis bxs :      GASTRIC ANTRAL MUCOSA WITH CHRONIC INACTIVE GASTRITIS AND INTESTINAL METAPLASIA.      IMMUNOHISTOCHEMICAL STAIN FOR H. PYLORI WILL BE REPORTED IN AN ADDENDUM.       NEGATIVE FOR DYSPLASIA.       5. Surgical [P], fundus :      GASTRIC ANTRAL / OXYNTIC MUCOSA WITH CHRONIC INACTIVE GASTRITIS AND INTESTINAL      METAPLASIA.      IMMUNOHISTOCHEMICAL STAIN FOR H. PYLORI WILL BE REPORTED IN AN ADDENDUM.      NEGATIVE FOR DYSPLASIA.      Colonoscopy 07/2021 - Two 6 to 8 mm polyps in the rectum and in the sigmoid colon, removed with a cold snare. Resected and retrieved. - Internal hemorrhoids. - The examination was otherwise normal on direct and retroflexion views.   EGD 07/2021 - Mild dilation in the entire esophagus. - Small hiatal hernia. - Gastritis. Biopsied. - Normal duodenal bulb and second portion of the duodenum. Biopsied.   1. Surgical [P], colon, rectum and sigmoid, polyp (2) - TUBULAR ADENOMA WITHOUT HIGH-GRADE DYSPLASIA OR MALIGNANCY - HYPERPLASTIC POLYP 2. Surgical [P], 2nd portion of duodenum and distal duodenum - DUODENAL MUCOSA WITH NO SPECIFIC HISTOPATHOLOGIC CHANGES - NEGATIVE FOR INCREASED INTRAEPITHELIAL LYMPHOCYTES OR VILLOUS ARCHITECTURAL CHANGES 3. Surgical [P], gastric antrum - GASTRIC ANTRAL MUCOSA WITH MILD CHRONIC GASTRITIS AND REACTIVE CHANGES - WARTHIN STARRY STAIN IS NEGATIVE FOR HELICOBACTER PYLORI 4. Surgical [P], gastric body and fundus - ANTRAL TYPE MUCOSA SHOWING CHRONIC GASTRITIS WITH ATROPHY, INTESTINAL METAPLASIA AND MICRONODULAR ECL CELL HYPERPLASIA, CONSISTENT WITH CHRONIC ATROPHIC AUTOIMMUNE GASTRITIS. SEE NOTE - NEGATIVE FOR DYSPLASIA - WARTHIN-STARRY STAIN IS NEGATIVE FOR HELICOBACTER PYLORI           3. Surgical [P], gastric body :      GASTRIC OXYNTIC MUCOSA WITH CHRONIC INACTIVE GASTRITIS.      IMMUNOHISTOCHEMICAL STAIN FOR H. PYLORI WILL BE REPORTED IN AN ADDENDUM.   Labs 08/26/23: Intrinsic factor AB (+)  Parietal cell AB ordered but not sent Gastrin 1926 [id number]   63 y.o. female here for assessment of the following   1. Gastroesophageal reflux disease, unspecified whether esophagitis  present   2. Burning sensation of mouth   3. Long-term current use of proton pump inhibitor therapy   4. Autoimmune gastritis   5. Elevated gastrin level     We reviewed her history at length today, this is my first time meeting her.   On chronic high-dose PPI for indigestion/reflux, also history of autoimmune gastritis complicated by iron and B12 deficiency.  She has had good improvement of her indigestion and nausea with PPI but continues to have burning in her oropharynx which seems a bit less likely to me to be related to reflux/waterbrash.  We discussed options to further evaluate this.  It is possible she could have nonacid reflux that could be contributing to this.  She does not have any erosive changes on her EGD.  She has had some evidence of  dysmotility on prior barium study as well as frank reflux.  To clarify if reflux is causing her oropharyngeal symptoms a 24-hour pH impedance test with manometry I think is reasonable to assess for nonacid reflux.  I discussed what this is with her.  Results of this can influence how much PPI she is on and long-term treatment.  If this study is normal on PPI then I would de-escalate her therapy and consider alternative diagnosis.  However, if she has significant nonacid reflux driving her symptoms, then could consider surgical evaluation or trial of baclofen.  Following discussion of 24-hour pH impedance test the manometry she wants to proceed.  This exam will be done on PPI.   We otherwise discussed long-term risks of chronic PPI use, she understands and for now we will continue current dosing.  Long-term, once we have results of the pH test, we will use the lowest daily dose needed to control her symptoms.   Otherwise reviewed her history of autoimmune gastritis and gastric intestinal metaplasia.  Counseled her this can increase the risk for gastric cancer, she does not have any family history of gastric cancer.  We discussed how frequently to do this in  regards to surveillance, I think a repeat endoscopy in 2-3 years is reasonable.  I did review her blood work, her gastrin level is markedly elevated.  I think this is very likely due to autoimmune gastritis, however we do not have gastric pH testing available to clarify this and gastrinoma/DES is also on the DDx.  She has been on PPI high dose at baseline.  No history of ulcers, I think again gastrinomas less likely but she has never had imaging of her abdomen.  For peace of mind I offered her a CT scan of her abdomen pelvis to ensure no evidence of gastrinoma or other pathology to be driving this given the marked elevation of the gastrin level.  Following discussion of this she did want a proceed with it.     PLAN: - schedule 24 hour pH impedance test and manometry ON nexium BID and pepcid. Assess for nonacid reflux and to detemrine long term plan. I am not convinced some of her symptoms are due to reflux - discussed long term PPI use and risks - long term want to use lowest dose needed to control symptoms. Will await pH testing first - discussed AIG - risks for gastric cancer, repeat EGD surveillance in 2 years - CT scan abdomen / pelvis given marked elevation in gastrin level     CT abdomen / pelvis 10/22/23: IMPRESSION: 1. No pancreatic ductal dilation or evidence of acute inflammation. No hyperenhancing pancreatic lesion. 2. Wall thickening versus underdistention of the gastric antrum, suggest correlation with endoscopy. 3. Colonic diverticulosis. 4. Aortic atherosclerosis.    Esophageal manometry and pH impedance testing 03/08/24: - Her manometry shows that she does have dysmotility of her esophagus, it is not contracting well and it is possible that could be correlating to some of her symptoms.  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  Unclear why she has this and we may need to have her see rheumatology to rule out connective tissue disorders - Her pH test on PPI is completely  normal.  She is not having any significant acid or nonacid reflux to be correlating to her symptoms.  Her symptoms did not correlate with reflux on this exam.  Therefore, I do not think her mouth burning symptoms are related to reflux. -I think probably best to see  her back in the office with me to discuss this in person and discuss long-term plan.  If she feels that PPI is providing benefit to her symptoms she can continue it for now, I can discuss long-term plan with her when I see her.  If she is having any significant dysphagia or problems swallowing in the interim, we can refer her to rheumatology.  If she is swallowing okay then we can monitor and I can talk with her further when I see her.  Can you book her a follow-up visit with me?  Thanks      Past Medical History:  Diagnosis Date   Adenomatous colon polyp 05/2007   Allergic rhinitis    Allergy    Anxiety    Asthma    with allergies   Autoimmune gastritis    Breast mass 10/13/2016   Cataract    Complication of anesthesia    states she is hyperactive post op with every past surgery   Gastric intestinal metaplasia    Gastric polyps    GERD (gastroesophageal reflux disease)    Hyperlipidemia    Iron deficiency anemia    prior to hysterectomy   Retinal tear of left eye    Vitamin B12 deficiency      Past Surgical History:  Procedure Laterality Date   37 HOUR PH STUDY N/A 03/08/2024   Procedure: MONITORING, ESOPHAGEAL PH, 24 HOUR;  Surgeon: Leigh Elspeth SQUIBB, MD;  Location: [facility];  Service: Gastroenterology;  Laterality: N/A;   ABDOMINAL HYSTERECTOMY     BILATERAL SALPINGECTOMY Bilateral 12/22/2013   Procedure: BILATERAL SALPINGECTOMY;  Surgeon: Burnard A. Kandyce, MD;  Location: [facility];  Service: Gynecology;  Laterality: Bilateral;   BREAST BIOPSY     left   BREAST CYST ASPIRATION  march and june 2015   multiple cyst aspirations in office procedure    BREAST EXCISIONAL BIOPSY Right 2016   BREAST EXCISIONAL  BIOPSY Left    BREAST LUMPECTOMY WITH RADIOACTIVE SEED LOCALIZATION Right 02/20/2015   Procedure: RIGHT BREAST LUMPECTOMY WITH RADIOACTIVE SEED LOCALIZATION;  Surgeon: Elon Pacini, MD;  Location: [facility];  Service: General;  Laterality: Right;   COLONOSCOPY     CYSTOCELE REPAIR     ESOPHAGEAL MANOMETRY N/A 03/08/2024   Procedure: MANOMETRY, ESOPHAGUS;  Surgeon: Leigh Elspeth SQUIBB, MD;  Location: [facility];  Service: Gastroenterology;  Laterality: N/A;   Left hand surgery     POLYPECTOMY     RECTOCELE REPAIR     RETINAL TEAR REPAIR CRYOTHERAPY     ROBOTIC ASSISTED TOTAL HYSTERECTOMY N/A 12/22/2013   Procedure: ROBOTIC ASSISTED TOTAL HYSTERECTOMY With Pelvic Washings;  Surgeon: Burnard LABOR. Kandyce, MD;  Location: [facility];  Service: Gynecology;  Laterality: N/A;   TOE SURGERY     left   TONSILLECTOMY     UPPER GASTROINTESTINAL ENDOSCOPY     vaginal deliveries  1990, 1995   WISDOM TOOTH EXTRACTION Bilateral    Family History  Problem Relation Age of Onset   Colon polyps Mother    Heart disease Father    Colon polyps Sister    Heart disease Sister    Esophageal cancer Neg Hx    Rectal cancer Neg Hx    Stomach cancer Neg Hx    Breast cancer Neg Hx    Colon cancer Neg Hx    Social History   Tobacco Use   Smoking status: Never   Smokeless tobacco: Never  Vaping Use   Vaping status: Never Used  Substance Use Topics   Alcohol use: Yes    Alcohol/week: 2.0 - 3.0 standard drinks of alcohol    Types: 2 - 3 Glasses of wine per week    Comment: 1 glass of wine per day   Drug use: No   Current Outpatient Medications  Medication Sig Dispense Refill   acetaminophen  (TYLENOL ) 500 MG tablet Take 1,000 mg by mouth as needed.     ADVAIR DISKUS 250-50 MCG/ACT AEPB Inhale 1 puff into the lungs 2 (two) times daily.     albuterol  (PROVENTIL  HFA;VENTOLIN  HFA) 108 (90 BASE) MCG/ACT inhaler Inhale 2 puffs into the lungs every 6 (six) hours as needed for wheezing. With  allergies     ALPRAZolam (XANAX) 0.5 MG tablet Take 0.5 mg by mouth at bedtime as needed for anxiety.     AMBULATORY NON FORMULARY MEDICATION Hormone pellets 1 injection every 4 months     CALCIUM CITRATE PO Take 1,000 mg by mouth 2 (two) times daily.     carboxymethylcellulose (REFRESH PLUS) 0.5 % SOLN 1 drop 3 (three) times daily as needed.     celecoxib (CELEBREX) 200 MG capsule Take by mouth 2 (two) times daily.     cholecalciferol (VITAMIN D3) 25 MCG (1000 UNIT) tablet Take by mouth.     ciprofloxacin (CIPRO) 250 MG tablet Take 250 mg by mouth as needed.     Collagen-Vitamin C-Biotin (COLLAGEN PO) Take by mouth.     Cyanocobalamin  (VITAMIN B-12 CR PO) Take 2 tablets by mouth daily.     cyclobenzaprine (FLEXERIL) 10 MG tablet Take 10 mg by mouth at bedtime as needed.     docusate sodium (COLACE) 100 MG capsule Take 200 mg by mouth at bedtime.     ezetimibe (ZETIA) 10 MG tablet      famotidine (PEPCID) 40 MG tablet Take 40 mg by mouth at bedtime.     fluticasone (FLONASE) 50 MCG/ACT nasal spray Place 1-2 sprays into both nostrils daily as needed for allergies or rhinitis.     ketotifen (ZADITOR) 0.025 % ophthalmic solution 1 drop 2 (two) times daily.     levocetirizine (XYZAL) 5 MG tablet Take 5 mg by mouth every evening.     LORazepam (ATIVAN) 0.5 MG tablet Take 0.5 mg by mouth every 8 (eight) hours.     methocarbamol (ROBAXIN) 500 MG tablet Take 500 mg by mouth 3 (three) times daily.     olopatadine (PATANOL) 0.1 % ophthalmic solution      OMEPRAZOLE  PO      Probiotic Product (FLORAJEN DIGESTION PO) Take 1 capsule by mouth every morning.     progesterone (PROMETRIUM) 200 MG capsule Take 200 mg by mouth daily.     rosuvastatin (CRESTOR) 40 MG tablet 40 mg.     traMADol (ULTRAM) 50 MG tablet Take 1 tablet every 4-6 hours by oral route.     triamcinolone cream (KENALOG) 0.1 % triamcinolone acetonide 0.1 % topical cream     No current facility-administered medications for this visit.    Allergies  Allergen Reactions   Hydrocodone  Other (See Comments)   Other     Chlorhexidine  gluconate 4%   Oxycodone  Nausea Only   Percocet [Oxycodone -Acetaminophen ] Other (See Comments)    Hyperactive   Wound Dressing Adhesive Other (See Comments)   Tape Rash     Review of Systems: All systems reviewed and negative except where noted in HPI.    No results found.  Physical Exam: LMP 11/10/2013  Constitutional: Pleasant,well-developed, ***female  in no acute distress. HEENT: Normocephalic and atraumatic. Conjunctivae are normal. No scleral icterus. Neck supple.  Cardiovascular: Normal rate, regular rhythm.  Pulmonary/chest: Effort normal and breath sounds normal. No wheezing, rales or rhonchi. Abdominal: Soft, nondistended, nontender. Bowel sounds active throughout. There are no masses palpable. No hepatomegaly. Extremities: no edema Lymphadenopathy: No cervical adenopathy noted. Neurological: Alert and oriented to person place and time. Skin: Skin is warm and dry. No rashes noted. Psychiatric: Normal mood and affect. Behavior is normal.   ASSESSMENT: 63 y.o. female here for assessment of the following  No diagnosis found.  PLAN:   Windy Coy, MD

## 2024-03-29 NOTE — Telephone Encounter (Signed)
 Called patient and discussed results.  She would like to be seen sooner if possible.  Dr. Leigh has a cancellation for tomorrow, 8-21 at 11am.  Patient would like to come in and discuss. Scheduled

## 2024-03-30 ENCOUNTER — Other Ambulatory Visit: Payer: Self-pay

## 2024-03-30 ENCOUNTER — Telehealth: Payer: Self-pay

## 2024-03-30 ENCOUNTER — Encounter: Payer: Self-pay | Admitting: Gastroenterology

## 2024-03-30 ENCOUNTER — Ambulatory Visit: Admitting: Gastroenterology

## 2024-03-30 VITALS — BP 128/72 | HR 94 | Ht 65.0 in | Wt 141.0 lb

## 2024-03-30 DIAGNOSIS — K224 Dyskinesia of esophagus: Secondary | ICD-10-CM | POA: Diagnosis not present

## 2024-03-30 DIAGNOSIS — R12 Heartburn: Secondary | ICD-10-CM

## 2024-03-30 DIAGNOSIS — R208 Other disturbances of skin sensation: Secondary | ICD-10-CM

## 2024-03-30 DIAGNOSIS — R682 Dry mouth, unspecified: Secondary | ICD-10-CM

## 2024-03-30 DIAGNOSIS — K2940 Chronic atrophic gastritis without bleeding: Secondary | ICD-10-CM

## 2024-03-30 DIAGNOSIS — Z79899 Other long term (current) drug therapy: Secondary | ICD-10-CM

## 2024-03-30 DIAGNOSIS — K219 Gastro-esophageal reflux disease without esophagitis: Secondary | ICD-10-CM

## 2024-03-30 NOTE — Progress Notes (Signed)
 Update med list

## 2024-03-30 NOTE — Telephone Encounter (Signed)
 Referral to [facility] faxed

## 2024-03-30 NOTE — Patient Instructions (Signed)
 We are referring you to [facility].  They will contact you directly to schedule an appointment.  It may take a week or more before you hear from them.  Please feel free to contact us  if you have not heard from them within 2 weeks and we will follow up on the referral.   We are referring you to [facility].  They will contact you directly to schedule an appointment.  It may take a week or more before you hear from them.  Please feel free to contact us  if you have not heard from them within 2 weeks and we will follow up on the referral.    Thank you for entrusting me with your care and for choosing [facility], Dr. Elspeth Naval    _______________________________________________________  If your blood pressure at your visit was 140/90 or greater, please contact your primary care physician to follow up on this.  _______________________________________________________  If you are age 96 or older, your body mass index should be between 23-30. Your Body mass index is 23.46 kg/m. If this is out of the aforementioned range listed, please consider follow up with your Primary Care Provider.  If you are age 34 or younger, your body mass index should be between 19-25. Your Body mass index is 23.46 kg/m. If this is out of the aformentioned range listed, please consider follow up with your Primary Care Provider.   ________________________________________________________  The [facility] providers would like to encourage you to use MYCHART to communicate with providers for non-urgent requests or questions.  Due to long hold times on the telephone, sending your provider a message by [facility] may be a faster and more efficient way to get a response.  Please allow 48 business hours for a response.  Please remember that this is for non-urgent requests.  _______________________________________________________  [facility] is using a team-based approach to care.  Your team is  made up of your doctor and two to three APPS. Our APPS (Nurse Practitioners and Physician Assistants) work with your physician to ensure care continuity for you. They are fully qualified to address your health concerns and develop a treatment plan. They communicate directly with your gastroenterologist to care for you. Seeing the Advanced Practice Practitioners on your physician's team can help you by facilitating care more promptly, often allowing for earlier appointments, access to diagnostic testing, procedures, and other specialty referrals.

## 2024-04-06 NOTE — Telephone Encounter (Signed)
 Rheumatology called stating they have received referral but no demographic or insurance information was attatched. Requesting for it to be faxed to [phone]. Please advise, thank you

## 2024-04-06 NOTE — Telephone Encounter (Signed)
 Demographics and insurance information faxed to number provided. Confirmation rec'd

## 2024-04-14 ENCOUNTER — Encounter: Payer: Self-pay | Admitting: Gastroenterology

## 2024-04-17 NOTE — Telephone Encounter (Signed)
 Fax rec'd from [facility]. Patient has been scheduled for an appointment with Dr. Jon Jacob on 05-12-24 at 8:30am

## 2024-04-20 ENCOUNTER — Other Ambulatory Visit: Payer: Self-pay | Admitting: Family Medicine

## 2024-04-20 DIAGNOSIS — Z1231 Encounter for screening mammogram for malignant neoplasm of breast: Secondary | ICD-10-CM

## 2024-05-04 ENCOUNTER — Encounter (INDEPENDENT_AMBULATORY_CARE_PROVIDER_SITE_OTHER): Payer: Self-pay | Admitting: Otolaryngology

## 2024-05-04 ENCOUNTER — Ambulatory Visit (INDEPENDENT_AMBULATORY_CARE_PROVIDER_SITE_OTHER): Admitting: Otolaryngology

## 2024-05-04 VITALS — BP 137/85 | HR 75

## 2024-05-04 DIAGNOSIS — R0981 Nasal congestion: Secondary | ICD-10-CM

## 2024-05-04 DIAGNOSIS — J3089 Other allergic rhinitis: Secondary | ICD-10-CM | POA: Diagnosis not present

## 2024-05-04 DIAGNOSIS — K146 Glossodynia: Secondary | ICD-10-CM | POA: Diagnosis not present

## 2024-05-04 DIAGNOSIS — H6990 Unspecified Eustachian tube disorder, unspecified ear: Secondary | ICD-10-CM

## 2024-05-04 DIAGNOSIS — J343 Hypertrophy of nasal turbinates: Secondary | ICD-10-CM

## 2024-05-04 DIAGNOSIS — J342 Deviated nasal septum: Secondary | ICD-10-CM

## 2024-05-04 DIAGNOSIS — H6993 Unspecified Eustachian tube disorder, bilateral: Secondary | ICD-10-CM

## 2024-05-04 MED ORDER — CAPSAICIN 0.025 % EX CREA: TOPICAL_CREAM | Freq: Two times a day (BID) | CUTANEOUS | 0 refills | Status: AC

## 2024-05-04 MED ORDER — FLUTICASONE PROPIONATE 50 MCG/ACT NA SUSP: 2.0000 | Freq: Two times a day (BID) | NASAL | 6 refills | Status: AC

## 2024-05-04 MED ORDER — OXYMETAZOLINE HCL 0.05 % NA SOLN: 1.0000 | Freq: Two times a day (BID) | NASAL | 0 refills | Status: AC

## 2024-05-04 MED ORDER — LEVOCETIRIZINE DIHYDROCHLORIDE 5 MG PO TABS: 5.0000 mg | ORAL_TABLET | Freq: Every evening | ORAL | 3 refills | Status: AC

## 2024-05-04 NOTE — Progress Notes (Signed)
 Patient admits that she is nervous. BP is down on second reading.

## 2024-05-04 NOTE — Progress Notes (Signed)
 ENT CONSULT:  Reason for Consult: burning mouth and ear pressure after flying  HPI: Discussed the use of AI scribe software for clinical note transcription with the patient, who gave verbal consent to proceed.  History of Present Illness Joan Cobb is a 63 year old female who presents with persistent oral burning and ear congestion.  She has experienced a burning sensation in her mouth for several years. Despite various evaluations, including consultations with an ear, nose, and throat specialist and gastroenterological tests, the cause of her symptoms remains unidentified. She has a known B12 deficiency and takes two B12 tablets daily. Previous tests for iron, folate, and B12 deficiencies have been conducted.  She reports recent ear congestion and muffled hearing following a flight on Monday. She has a history of numerous allergies and takes levocetirizine daily for management. She suspects she may have missed doses during her recent travel, which could have contributed to her symptoms. She uses nasal spray regularly and has been prescribed Xyzal  (levocetirizine) and Flonase  for her allergies. She reports nasal congestion.    Records Reviewed:  GI Dr Leigh, office visit 03/30/24 63 year old female here for a follow-up visit.  Recall she has a history of atrophic/autoimmune gastritis with gastric intestinal metaplasia, GERD , chronic burning sensation in her mouth.   Please see my note from March of this year for my full intake history.  She was previously followed by Dr. Aneita.   She has had multiple endoscopies over the years dating back to 2013.  She has had gastric intestinal metaplasia, atrophic/autoimmune gastritis known for years and has been followed with surveillance endoscopy.  Most recently she had an endoscopy with Dr. Aneita in December 2024, she has some benign hyperplastic polyps removed, she had scattered areas of gastritis with gastric intestinal metaplasia  without dysplasia.  She has a history of B12 deficiency that normalized with supplementation.  Intrinsic factor antibody was positive.  She stopped her PPI for a few weeks and gastrin level was over 1900.  No reported gastric or duodenal ulcers that I can see in her chart  procedures.   She has also had chronic reflux.  She has been on PPI long-term for years.  When I met her on Nexium 40 mg twice daily and Pepcid 40 mg nightly.  This has controlled her reflux symptoms but she has been having burning mouth symptoms for some time.  She also has dry mouth with this as well as dry eyes.  Occasional dysphagia.  PPI for years, DEXA scan normal in 2024.  No history of CKD    Past Medical History:  Diagnosis Date   Adenomatous colon polyp 05/2007   Allergic rhinitis    Allergy    Anxiety    Asthma    with allergies   Autoimmune gastritis    Breast mass 10/13/2016   Cataract    Complication of anesthesia    states she is hyperactive post op with every past surgery   Gastric intestinal metaplasia    Gastric polyps    GERD (gastroesophageal reflux disease)    Hyperlipidemia    Iron deficiency anemia    prior to hysterectomy   Retinal tear of left eye    Vitamin B12 deficiency     Past Surgical History:  Procedure Laterality Date   8 HOUR PH STUDY N/A 03/08/2024   Procedure: MONITORING, ESOPHAGEAL PH, 24 HOUR;  Surgeon: Leigh Elspeth SQUIBB, MD;  Location: [facility];  Service: Gastroenterology;  Laterality: N/A;  ABDOMINAL HYSTERECTOMY     BILATERAL SALPINGECTOMY Bilateral 12/22/2013   Procedure: BILATERAL SALPINGECTOMY;  Surgeon: Burnard A. Kandyce, MD;  Location: [facility];  Service: Gynecology;  Laterality: Bilateral;   BREAST BIOPSY     left   BREAST CYST ASPIRATION  march and june 2015   multiple cyst aspirations in office procedure    BREAST EXCISIONAL BIOPSY Right 2016   BREAST EXCISIONAL BIOPSY Left    BREAST LUMPECTOMY WITH RADIOACTIVE SEED LOCALIZATION Right 02/20/2015    Procedure: RIGHT BREAST LUMPECTOMY WITH RADIOACTIVE SEED LOCALIZATION;  Surgeon: Elon Pacini, MD;  Location: [facility];  Service: General;  Laterality: Right;   COLONOSCOPY     CYSTOCELE REPAIR     ESOPHAGEAL MANOMETRY N/A 03/08/2024   Procedure: MANOMETRY, ESOPHAGUS;  Surgeon: Leigh Elspeth SQUIBB, MD;  Location: [facility];  Service: Gastroenterology;  Laterality: N/A;   Left hand surgery     POLYPECTOMY     RECTOCELE REPAIR     RETINAL TEAR REPAIR CRYOTHERAPY     ROBOTIC ASSISTED TOTAL HYSTERECTOMY N/A 12/22/2013   Procedure: ROBOTIC ASSISTED TOTAL HYSTERECTOMY With Pelvic Washings;  Surgeon: Burnard LABOR. Kandyce, MD;  Location: [facility];  Service: Gynecology;  Laterality: N/A;   TOE SURGERY     left   TONSILLECTOMY     UPPER GASTROINTESTINAL ENDOSCOPY     vaginal deliveries  1990, 1995   WISDOM TOOTH EXTRACTION Bilateral     Family History  Problem Relation Age of Onset   Colon polyps Mother    Heart disease Father    Colon polyps Sister    Heart disease Sister    Esophageal cancer Neg Hx    Rectal cancer Neg Hx    Stomach cancer Neg Hx    Breast cancer Neg Hx    Colon cancer Neg Hx     Social History:  reports that she has never smoked. She has never used smokeless tobacco. She reports current alcohol use of about 2.0 - 3.0 standard drinks of alcohol per week. She reports that she does not use drugs.  Allergies:  Allergies  Allergen Reactions   Chlorhexidine  Dermatitis   Chlorhexidine  Gluconate Dermatitis    chlorhexidine  gluconate   Hydrocodone  Other (See Comments)   Other Other (See Comments)    Chlorhexidine  gluconate 4%   Oxycodone  Nausea Only and Other (See Comments)   Oxycodone -Acetaminophen  Nausea Only    acetaminophen  / oxycodone    Percocet [Oxycodone -Acetaminophen ] Other (See Comments)    Hyperactive   Silicone Dermatitis   Wound Dressing Adhesive Other (See Comments)   Tape Rash    Medications: I have reviewed the patient's current  medications.  The PMH, PSH, Medications, Allergies, and SH were reviewed and updated.  ROS: Constitutional: Negative for fever, weight loss and weight gain. Cardiovascular: Negative for chest pain and dyspnea on exertion. Respiratory: Is not experiencing shortness of breath at rest. Gastrointestinal: Negative for nausea and vomiting. Neurological: Negative for headaches. Psychiatric: The patient is not nervous/anxious  Blood pressure 137/85, pulse 75, last menstrual period 11/10/2013, SpO2 96%.  PHYSICAL EXAM:  Blood pressure 137/85, pulse 75, last menstrual period 11/10/2013, SpO2 96%. There is no height or weight on file to calculate BMI.  PHYSICAL EXAM:  Exam: General: Well-developed, well-nourished Communication and Voice: Clear pitch and clarity Respiratory Respiratory effort: Equal inspiration and expiration without stridor Cardiovascular Peripheral Vascular: Warm extremities with equal color/perfusion Eyes: No nystagmus with equal extraocular motion bilaterally Neuro/Psych/Balance: Patient oriented to person, place, and time; Appropriate mood and  affect; Gait is intact with no imbalance; Cranial nerves I-XII are intact Head and Face Inspection: Normocephalic and atraumatic without mass or lesion Palpation: Facial skeleton intact without bony stepoffs Salivary Glands: No mass or tenderness Facial Strength: Facial motility symmetric and full bilaterally ENT Pinna: External ear intact and fully developed External canal: Canal is patent with intact skin Tympanic Membrane: Clear and mobile External Nose: No scar or anatomic deformity Internal Nose: Septum is S-shaped. No polyp, or purulence. Mucosal edema and erythema present.  Bilateral inferior turbinate hypertrophy.  Lips, Teeth, and gums: Mucosa and teeth intact and viable TMJ: No pain to palpation with full mobility Oral cavity/oropharynx: No erythema or exudate, no lesions present Nasopharynx: No mass or lesion  with intact mucosa Neck Neck and Trachea: Midline trachea without mass or lesion Thyroid: No mass or nodularity Lymphatics: No lymphadenopathy    PROCEDURE NOTE: nasal endoscopy  Preoperative diagnosis: chronic sinusitis symptoms  Postoperative diagnosis: same  Procedure: Diagnostic nasal endoscopy (68768)  Surgeon: Elena Larry, M.D.  Anesthesia: Topical lidocaine  and Afrin  H&P REVIEW: The patient's history and physical were reviewed today prior to procedure. All medications were reviewed and updated as well. Complications: None Condition is stable throughout exam Indications and consent: The patient presents with symptoms of chronic sinusitis not responding to previous therapies. All the risks, benefits, and potential complications were reviewed with the patient preoperatively and informed consent was obtained. The time out was completed with confirmation of the correct procedure.   Procedure: The patient was seated upright in the clinic. Topical lidocaine  and Afrin were applied to the nasal cavity. After adequate anesthesia had occurred, the rigid nasal endoscope was passed into the nasal cavity. The nasal mucosa, turbinates, septum, and sinus drainage pathways were visualized bilaterally. This revealed no purulence or significant secretions that might be cultured. There were no polyps or sites of significant inflammation. The mucosa was intact and there was no crusting present. The scope was then slowly withdrawn and the patient tolerated the procedure well. There were no complications or blood loss.   Studies Reviewed: CXR 10/30/20 IMPRESSION: No edema or airspace opacity.  Heart size normal.   Assessment/Plan: Encounter Diagnoses  Name Primary?   Chronic nasal congestion Yes   Dysfunction of both eustachian tubes    Environmental and seasonal allergies    Hypertrophy of both inferior nasal turbinates    Nasal septal deviation    Burning mouth syndrome      Assessment and Plan Assessment & Plan Burning mouth syndrome Chronic oral burning sensation with no clear etiology. She has B12 deficiency managed with supplementation currently. Possible hormonal influence considered vs folate/iron deficiencies, vs other causes of burning mouth syndrome - Prescribed topical capsaicin . - Provided handout on supplements: alpha lipoic acid, B12, topical vitamin E, capsaicin . - Advised reading handout for dosage and information.  Eustachian tube dysfunction with chronic nasal congestion  Intermittent ear symptoms post-air travel due to Eustachian tube dysfunction and nasal congestion. Allergic rhinitis managed with levocetirizine and nasal spray.  - Re-prescribed levocetirizine (Xyzal ) and Flonase . - Prescribed Afrin for 2 days to relieve congestion. - Advise against habitual Afrin use to prevent rebound congestion.   Thank you for allowing me to participate in the care of this patient. Please do not hesitate to contact me with any questions or concerns.   Elena Larry, MD Otolaryngology [facility] Phone: [phone] Fax: [phone]    05/04/2024, 9:08 AM

## 2024-05-04 NOTE — Patient Instructions (Signed)
 BURNING MOUTH SYNDROME  1. Stress management helps more than any single other intervention. This could mean using a meditation app like Headspace, working a therapist who does Engineer[employer] systems [employer], or finding a counselor through your primary care doctor or an online app like Better[employer].  2. Capsaicin has been shown to help by "burning out" the pain receptors. This is a chemical that comes from the red (hot) pepper. It works by depleting substance P, the chemical that allows nerves to communicate pain/itch. In your condition, the nerves are overactive in the skin. Depleting their ability to signal will result in loss of ability to signal baseline itch/pain. Apply Capsaicin at prescribed doseage (0.025% for oral mucosa).  Please wear gloves or thoroughly wash hands after application.  If you do get this on another body area that is undesired, using olive or vegetable oil is best to dissolve this chemical but does not work instantaneously. It will burn at first, but that will get better with continued use.  You do need to stick to this treatment for it to be effective. Treat in the following manner. Capsaicin 4x daily to affected area x 1 week Capsaicin 3x daily to affected area x 1 week Capsaicin 2x daily to affected area x 2 weeks Capsaicin 1x daily to affected area until follow-up/as needed.  3. Vitamin B12 daily  4. Make sure your doctor has sent bloodwork to check for any issues with vitamin levels  5. Try taking alpha lipoic acid dose of 600-800 mg/day   Anecdotal treatments from patients: 1. Vitamin E oil (open the capsule and apply it directly to tongue). 2.  Lysine supplementation  Patient Information Sheet: Burning Mouth Syndrome (BMS)**  What is Burning Mouth Syndrome (BMS)? Burning Mouth Syndrome (BMS) is a condition characterized by a persistent, often painful, burning sensation in the mouth without an obvious cause. It can affect the tongue, lips, gums, palate, or  the entire mouth.  Symptoms of BMS: - Burning Sensation: Typically feels like scalding or tingling in the mouth, often worsening as the day progresses. - Dry Mouth: You may feel like your mouth is dry, even though saliva production is normal. - Altered Taste: Some patients experience a bitter or metallic taste in the mouth. - Numbness or Tingling: Occasionally, patients report sensations of numbness or tingling.  Who Gets BMS? BMS most commonly affects: - Women, especially postmenopausal women. - Adults over 45 years old.  Possible Causes: The exact cause of BMS is often unknown, but some contributing factors include: - Nerve Damage: Problems with nerves that control taste and pain. - Hormonal Changes: Especially in women going through menopause. - Nutritional Deficiencies: Low levels of iron, zinc, or vitamin B12. - Dry Mouth: From medications or conditions like Sjgren's syndrome. - Allergies or Reactions: To foods, dental materials, or oral care products. - Stress and Anxiety: Psychological factors may exacerbate symptoms.  #### **Diagnosis:** BMS is a diagnosis of exclusion, meaning it is diagnosed after other causes are ruled out. Your doctor or dentist may perform: - A physical examination. - Blood tests to check for nutritional deficiencies. - Allergy tests or saliva flow tests. - Oral swabs to rule out infections.  #### **Treatment Options:** While there is no definitive cure for BMS, the following treatments may help alleviate symptoms: 1. **Medications**:    - Pain relievers, such as over-the-counter analgesics or prescription medications like tricyclic antidepressants, benzodiazepines, or anticonvulsants.    - Oral rinses with anesthetic properties. 2. **Saliva Substitutes**: To relieve  dry mouth symptoms. 3. **Dietary Changes**: Adjusting your diet to avoid spicy, acidic, or irritating foods. 4. **Vitamin or Mineral Supplements**: If deficiencies are identified. 5.  **Cognitive Behavioral Therapy (CBT)**: For managing anxiety or stress that may worsen symptoms. 6. **Avoid Irritants**: Avoid alcohol, tobacco, and irritating dental products.  #### **Self-care Tips:** - **Stay Hydrated**: Drink plenty of water to keep your mouth moist. - **Avoid Triggers**: Spicy, acidic foods, and alcohol may exacerbate symptoms. - **Practice Good Oral Hygiene**: Use gentle, non-irritating oral care products. - **Stress Management**: Engage in relaxation techniques like meditation or yoga.

## 2024-05-07 ENCOUNTER — Encounter (INDEPENDENT_AMBULATORY_CARE_PROVIDER_SITE_OTHER): Payer: Self-pay | Admitting: Otolaryngology

## 2024-05-10 ENCOUNTER — Ambulatory Visit
Admission: RE | Admit: 2024-05-10 | Discharge: 2024-05-10 | Disposition: A | Source: Ambulatory Visit | Attending: Family Medicine | Admitting: Family Medicine

## 2024-05-10 DIAGNOSIS — Z1231 Encounter for screening mammogram for malignant neoplasm of breast: Secondary | ICD-10-CM

## 2024-06-08 ENCOUNTER — Ambulatory Visit: Admitting: Gastroenterology

## 2024-06-14 ENCOUNTER — Ambulatory Visit (INDEPENDENT_AMBULATORY_CARE_PROVIDER_SITE_OTHER): Admitting: Psychiatry

## 2024-06-14 DIAGNOSIS — F411 Generalized anxiety disorder: Secondary | ICD-10-CM | POA: Diagnosis not present

## 2024-06-14 NOTE — Progress Notes (Signed)
 [facility] Counsel[facility] Initial Adult Exam  Name: Joan Cobb Date: 06/14/2024 MRN: [id number] DOB: April 27, 1961 PCP: Windy Coy, MD  Time spent:  55 minutes  Guardian/Payee:  patient    Paperwork requested:  No   Reason for Visit /Presenting Problem: (has been in therapy previously multiple times and most recently 2 yrs ago) . Anxious, unhappiness, work frustrations (a lot), personal frustrations, depression, issues/concerns with parents in their 30's and it's getting more obvious that they are showing more symptoms of aging and are not as healthy as they have been in past, and wanting to have better relationship with them and be OK.   Mental Status Exam:    Appearance:   Casual, Neat, and Well Groomed     Behavior:  Appropriate, Sharing, and Motivated  Motor:  Normal  Speech/Language:   Clear and Coherent  Affect:  Depressed and anxious  Mood:  anxious, depressed, and sad  Thought process:  goal directed  Thought content:    Rumination  Sensory/Perceptual disturbances:    WNL  Orientation:  oriented to person, place, time/date, situation, day of week, month of year, year, and stated date of Nov. 5, 2025  Attention:  Good  Concentration:  Good  Memory:  WNL  Fund of knowledge:   Good  Insight:    Good  Judgment:   Good  Impulse Control:  Good   Reported Symptoms:  see above  Risk Assessment: Danger to Self:  No Self-injurious Behavior: No Danger to Others: No Duty to Warn:no Physical Aggression / Violence:No  Access to Firearms a concern: No  Gang Involvement:No  Patient / guardian was educated about steps to take if suicide or homicide risk level increases between visits: yes While future psychiatric events cannot be accurately predicted, the patient does not currently require acute inpatient psychiatric care and does not currently meet [place]  involuntary commitment criteria.  Substance Abuse History: Current substance abuse: some alcohol but  mostly on weekend but never intoxicated to where I can't function well    Past Psychiatric History:   Previous psychological history is significant for anxiety and depression Outpatient Providers:multiple providers History of Psych Hospitalization: No  Psychological Testing: no   Abuse History: Victim of No., n/a   Report needed: No. Victim of Neglect:No. Perpetrator of n/a  Witness / Exposure to Domestic Violence: No   Protective Services Involvement: No  Witness to [employer] Violence:  No   Family History:  Patient confirms info below.  Family History  Problem Relation Age of Onset   Colon polyps Mother    Heart disease Father    Colon polyps Sister    Heart disease Sister    Esophageal cancer Neg Hx    Rectal cancer Neg Hx    Stomach cancer Neg Hx    Breast cancer Neg Hx    Colon cancer Neg Hx     Living situation: the patient lives with their spouse  Sexual Orientation:  Straight  Relationship Status: married 41 yrs. Name of spouse / other:n/a             If a parent, number of children / ages:2 daughters, ages 110 and 63 and patient is close to both of them. One lives in [place] and other one lives in [place]. Grandchildren- 2 and another one was miscarried.  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 History of legal issue / charges: n/a  Medical History/Surgical History:Patient confirms info below. Past Medical History:  Diagnosis Date   Adenomatous colon polyp 05/2007   Allergic rhinitis    Allergy    Anxiety    Asthma    with allergies   Autoimmune gastritis    Breast mass 10/13/2016   Cataract    Complication of anesthesia    states she is hyperactive post op with every past surgery   Gastric intestinal metaplasia    Gastric polyps    GERD (gastroesophageal reflux disease)    Hyperlipidemia    Iron deficiency anemia    prior to hysterectomy   Retinal tear of left eye    Vitamin B12 deficiency     Past Surgical History:  Procedure Laterality Date   66 HOUR PH STUDY N/A 03/08/2024   Procedure: MONITORING, ESOPHAGEAL PH, 24 HOUR;  Surgeon: Leigh Elspeth SQUIBB, MD;  Location: [facility];  Service: Gastroenterology;  Laterality: N/A;   ABDOMINAL HYSTERECTOMY     BILATERAL SALPINGECTOMY Bilateral 12/22/2013   Procedure: BILATERAL SALPINGECTOMY;  Surgeon: Burnard A. Kandyce, MD;  Location: [facility];  Service: Gynecology;  Laterality: Bilateral;   BREAST BIOPSY     left   BREAST CYST ASPIRATION  march and june 2015   multiple cyst aspirations in office procedure    BREAST EXCISIONAL BIOPSY Right 2016   BREAST EXCISIONAL BIOPSY Left    BREAST LUMPECTOMY WITH RADIOACTIVE SEED LOCALIZATION Right 02/20/2015   Procedure: RIGHT BREAST LUMPECTOMY WITH RADIOACTIVE SEED LOCALIZATION;  Surgeon: Elon Pacini, MD;  Location: [facility];  Service: General;  Laterality: Right;   COLONOSCOPY     CYSTOCELE REPAIR     ESOPHAGEAL MANOMETRY N/A 03/08/2024   Procedure: MANOMETRY, ESOPHAGUS;  Surgeon: Leigh Elspeth SQUIBB, MD;  Location: [facility];  Service: Gastroenterology;  Laterality: N/A;   Left hand surgery     POLYPECTOMY      RECTOCELE REPAIR     RETINAL TEAR REPAIR CRYOTHERAPY     ROBOTIC ASSISTED TOTAL HYSTERECTOMY N/A 12/22/2013   Procedure: ROBOTIC ASSISTED TOTAL HYSTERECTOMY With Pelvic Washings;  Surgeon: Burnard LABOR. Kandyce, MD;  Location: [facility];  Service: Gynecology;  Laterality: N/A;   TOE SURGERY     left   TONSILLECTOMY     UPPER GASTROINTESTINAL ENDOSCOPY     vaginal deliveries  1990, 1995   WISDOM TOOTH EXTRACTION Bilateral     Medications: Current Outpatient Medications  Medication Sig Dispense Refill   acetaminophen  (TYLENOL ) 500 MG tablet Take 1,000 mg by mouth as needed.     ADVAIR DISKUS 250-50 MCG/ACT AEPB Inhale 1 puff into the lungs 2 (two) times daily.     albuterol  (PROVENTIL  HFA;VENTOLIN  HFA) 108 (90 BASE) MCG/ACT inhaler Inhale 2 puffs into the lungs every 6 (six) hours as needed for wheezing. With allergies     ALPRAZolam (XANAX) 0.5 MG tablet Take 0.5 mg by mouth at bedtime as needed for anxiety.  AMBULATORY NON FORMULARY MEDICATION Hormone pellets 1 injection every 4 months     CALCIUM CITRATE PO Take 1,000 mg by mouth 2 (two) times daily.     capsaicin  (ZOSTRIX) 0.025 % cream Apply topically 2 (two) times daily. 60 g 0   carboxymethylcellulose (REFRESH PLUS) 0.5 % SOLN 1 drop 3 (three) times daily as needed.     celecoxib (CELEBREX) 200 MG capsule Take by mouth 2 (two) times daily.     cholecalciferol (VITAMIN D3) 25 MCG (1000 UNIT) tablet Take by mouth.     ciprofloxacin (CIPRO) 250 MG tablet Take 250 mg by mouth as needed.     Collagen-Vitamin C-Biotin (COLLAGEN PO) Take by mouth.     Cyanocobalamin  (VITAMIN B-12 CR PO) Take 2 tablets by mouth daily.     cyclobenzaprine (FLEXERIL) 10 MG tablet Take 10 mg by mouth at bedtime as needed.     docusate sodium (COLACE) 100 MG capsule Take 200 mg by mouth at bedtime.     esomeprazole (NEXIUM) 40 MG packet Take 40 mg by mouth daily.     ezetimibe (ZETIA) 10 MG tablet      famotidine (PEPCID) 40 MG tablet Take 40 mg by mouth at  bedtime.     fluticasone  (FLONASE ) 50 MCG/ACT nasal spray Place 1-2 sprays into both nostrils daily as needed for allergies or rhinitis.     fluticasone  (FLONASE ) 50 MCG/ACT nasal spray Place 2 sprays into both nostrils 2 (two) times daily. 16 g 6   ketotifen (ZADITOR) 0.025 % ophthalmic solution 1 drop 2 (two) times daily.     levocetirizine (XYZAL  ALLERGY 24HR) 5 MG tablet Take 1 tablet (5 mg total) by mouth every evening. 30 tablet 3   levocetirizine (XYZAL ) 5 MG tablet Take 5 mg by mouth every evening.     LORazepam (ATIVAN) 0.5 MG tablet Take 0.5 mg by mouth every 8 (eight) hours.     methocarbamol (ROBAXIN) 500 MG tablet Take 500 mg by mouth 3 (three) times daily.     olopatadine (PATANOL) 0.1 % ophthalmic solution      oxymetazoline  (AFRIN NASAL SPRAY) 0.05 % nasal spray Place 1 spray into both nostrils 2 (two) times daily. Stop after 48 hrs 30 mL 0   Probiotic Product (FLORAJEN DIGESTION PO) Take 1 capsule by mouth every morning.     progesterone (PROMETRIUM) 200 MG capsule Take 200 mg by mouth daily.     rosuvastatin (CRESTOR) 40 MG tablet 40 mg.     traMADol (ULTRAM) 50 MG tablet Take 1 tablet every 4-6 hours by oral route.     triamcinolone cream (KENALOG) 0.1 % triamcinolone acetonide 0.1 % topical cream     No current facility-administered medications for this visit.    Allergies  Allergen Reactions   Chlorhexidine  Dermatitis   Chlorhexidine  Gluconate Dermatitis    chlorhexidine  gluconate   Hydrocodone  Other (See Comments)   Other Other (See Comments)    Chlorhexidine  gluconate 4%   Oxycodone  Nausea Only and Other (See Comments)   Oxycodone -Acetaminophen  Nausea Only    acetaminophen  / oxycodone    Percocet [Oxycodone -Acetaminophen ] Other (See Comments)    Hyperactive   Silicone Dermatitis   Wound Dressing Adhesive Other (See Comments)   Tape Rash    Diagnoses:    ICD-10-CM   1. Generalized anxiety disorder  F41.1      Treatment goal plan of care: Worked with  patient collaboratively on her treatment plan and she is in agreement with it.  Her goals  will remain on treatment plan as patient works with strategies in sessions and outside of sessions to meet her goals.  Patient is signing a copy of her printed treatment goal plan instead of signing online.  A copy is also kept at the office here.  Progress will be assessed each session and documented in the progress or plan sections of treatment note.   1.  Identify life conflicts and/or situations including from the past and the present, that support her current symptomology of anxiety/depression. 2.  Develop behavioral and cognitive strategies to reduce or eliminate excessive anxiety, depression. 3.  Patient will work on developing reality based, positive cognitive messages that can help her improve her mood and outlook while also working on building/strengthening self-confidence.  Plan of Care:  This is a first appointment with this patient for therapy and today we collaboratively completed her initial evaluation and initial treatment goal plan.  Dai Apel is a 63 year old, married female patient, married for 41 years to her husband who is a former emergency planning/management officer and he works on his own with various woodworking projects as he is retired.  Has been very supportive of patient including her coming for therapy.  Patient has been in therapy in the past with other therapists and most recently stopped about 2 years ago.  Currently she reports anxiety, unhappiness, work frustrations a lot, personal frustrations, depression, issues/concerns with parents in their 16s and their aging is more obvious to her at this point.  Patient presents for appointment today neatly, casually dressed, motivated with some depression and anxiety, some sadness.  No tearfulness.  Her thought process is goal-directed and acknowledges that she ruminates a good bit.  Well oriented to person/place/time/date/situation/day of week/month of  year/year and stated date of June 14, 2024.  Her concentration, attention, insight, judgment, and impulse control are all good.  Denies any thoughts to harm self nor others.  Some use of substances including mostly alcohol on the weekends but never so intoxicated to where I cannot function well.  Has seen multiple outpatient providers for mental health previously and reports most of it was for anxiety and depression.  Patient lives with her spouse.  They have 2 daughters ages 59 and 61 and patient feels close to both of them.  1 lives in [place]  and the other 1 in [place].  They have 2 grandchildren.  Patient is employed as a paralegal and has somewhat of a flexible schedule which she appreciates.  She enjoys gardening, reading, and tries to exercise x 3 weekly.  She reports her stressors to include some financial tightness or difficulties at times, health problems, occupational concerns, and notes that her health issues are more minor.  She reports her strengths to include supportive relationships, family, a sense of spirituality, friends, hopefulness, and that she is able to communicate effectively.  She knows of no barriers to treatment.  Reports that her family history includes father struggling some with depression and anxiety and his side of the family has a lot of mental health issues including some lower functioning.  Father tends to be easily crushed by things and continuously talks about negatives in the past.  Patient reports her current support system includes a couple of friends, husband, and 2 daughters who are very different from each other but both are supportive.  Patient seems to be feeling positive about her efforts to work on her established goals which we worked on in session today and states once I put  my mind to something, I will do it. Patient actively involved in session today providing helpful information and also in determining treatment goals.  Other  information gathered during this initial evaluation that help support patient's need for therapy can be found in the above sections of this evaluation.  Initial treatment goal review and patient is in agreement.  To return for next appointment within 2 weeks.   Barnie Bunde, LCSW

## 2024-06-29 ENCOUNTER — Ambulatory Visit: Admitting: Psychiatry

## 2024-07-05 ENCOUNTER — Ambulatory Visit (INDEPENDENT_AMBULATORY_CARE_PROVIDER_SITE_OTHER): Admitting: Psychiatry

## 2024-07-05 DIAGNOSIS — F411 Generalized anxiety disorder: Secondary | ICD-10-CM

## 2024-07-05 NOTE — Progress Notes (Signed)
 [facility] Counselor/Therapist Progress Note  Patient ID: Joan Cobb, MRN: [id number],    Date: 07/05/2024  Time Spent: 55 minutes   Treatment Type: Individual Therapy  Reported Symptoms: (Has been in therapy before several times within 2 years ago).  Today reporting anxiety, work frustrations, personal frustrations, unhappiness, depression, issues/concerns with aging parents in their 36s, and parents are showing more symptoms of their aging and are not as healthy as they have been in the past.  Patient is wanting to have a better relationship with them and be okay.  Mental Status Exam:  Appearance:   Casual and Neat     Behavior:  Appropriate, Sharing, and Motivated  Motor:  Normal  Speech/Language:   Clear and Coherent  Affect:  Depressed and anxious  Mood:  anxious and depressed  Thought process:  goal directed  Thought content:    Rumination  Sensory/Perceptual disturbances:    WNL  Orientation:  oriented to person, place, time/date, situation, day of week, month of year, year, and stated date of Nov. 26, 2025.  Attention:  Good  Concentration:  Good  Memory:  WNL  Fund of knowledge:   Good  Insight:    Good  Judgment:   Good  Impulse Control:  Good   Risk Assessment: Danger to Self:  No Self-injurious Behavior: No Danger to Others: No Duty to Warn:no Physical Aggression / Violence:No  Access to Firearms a concern: No  Gang Involvement:No   Subjective:     Patient in appointment today working further on her anxiety, work frustrations, personal frustrations, depression, unhappiness, issues/concerns with parents in their 75s and their aging.  She is casually and neatly dressed and seems to be motivated to work on her goals and help better manage her depression and anxiety, and also some sadness.  Thought process remains goal-directed and patient shares that she does ruminate often. Did her homework list and today wants/needs to talk about my job and  disappointments, has hx of a lot of stress in job but not quite as bad, very disappointed and very tired, and hurt by and frustrated with certain people. Confronting and working on eventual retirement issues. Significant current issues re: job and another possibility. (Not all details included in this note due to patient privacy needs.) Some uncertainty regarding important decisions and some regarding herself and certain decisions she wants to make that are very private for her.  Feels good about having family live close by.  When possible, she enjoys gardening, reading, and trying to exercise 3 times a week.  Good self-care but also having a lot of stressors especially regarding her job, some financial tightness, and minor health issues.  She does appreciate having supportive relationships, her family, friends, a sense of hopefulness and a sense of spirituality.  Does plan to speak with her boss but trying to figure out best ways to handle some of the stressors involved as well as some decisions to be made outside of that environment.  Worked well in session today with part of her time being spent on helping her better manage those stressors and figuring out next steps for herself.      Interventions: Cognitive Behavioral Therapy, Solution-Oriented/Positive Psychology, and Ego-Supportive 1.  Identify life conflicts and/or situations including from the past and the present, that support her current symptomology of anxiety/depression. 2.  Develop behavioral and cognitive strategies to reduce or eliminate excessive anxiety, depression. 3.  Patient will work on engineer, mining based, positive  cognitive messages that can help her improve her mood and outlook while also working on building/strengthening self-confidence.   Diagnosis:   ICD-10-CM   1. Generalized anxiety disorder  F41.1      Plan: Patient working very well in session today, coming in with a lot of energy and some frustration regarding her  personal issues.  She was very open today in session and very direct about what her concerns were and understandably wanting to do something in order to change her situation.  (Due to the nature of her concerns, not all details are included in this note.) we will pick up and talk about this further at her next appointment.  Patient was encouraged to focus more on her own self-care particularly in activities that she enjoys and and also allowing for enough sleep, and time with friends and supportive people.  Particularly wants to include gardening, reading, and exercise in her self-care as these tend to help her de-stress.  Goal review and progress/challenges noted with patient.  Next appointment within 2 to 3 weeks.   Barnie Bunde, LCSW

## 2024-07-10 ENCOUNTER — Ambulatory Visit: Admitting: Psychiatry

## 2024-07-26 ENCOUNTER — Ambulatory Visit: Admitting: Psychiatry

## 2024-07-26 DIAGNOSIS — F411 Generalized anxiety disorder: Secondary | ICD-10-CM | POA: Diagnosis not present

## 2024-07-26 NOTE — Progress Notes (Signed)
 [facility] Counselor/Therapist Progress Note  Patient ID: Joan Cobb, MRN: [id number],    Date: 07/26/2024  Time Spent: 55 minutes   Treatment Type: Individual Therapy  Reported Symptoms: anxiety but some increased hopefulness, less depression, some work challenges, concerns for her aging parents in their 10s and patient is wanting a better relationship with them.   Mental Status Exam:  Appearance:   Casual, Neat, and Well Groomed     Behavior:  Appropriate, Sharing, and Motivated  Motor:  Normal  Speech/Language:   Clear and Coherent  Affect:  anxious  Mood:  anxious  Thought process:  goal directed  Thought content:    WNL  Sensory/Perceptual disturbances:    WNL  Orientation:  oriented to person, place, time/date, situation, day of week, month of year, year, and stated date of Dec. 17, 2025  Attention:  Good  Concentration:  Good  Memory:  WNL  Fund of knowledge:   Good  Insight:    Good  Judgment:   Good  Impulse Control:  Good   Risk Assessment: Danger to Self:  No Self-injurious Behavior: No Danger to Others: No Duty to Warn:no Physical Aggression / Violence:No  Access to Firearms a concern: No  Gang Involvement:No   Subjective:    Patient working further in appointment today on her anxiety, personal frustrations, work frustrations, depression, unhappiness, and concerns with her parents who are in their 31s.  Is following up on a different work opportunity where she has already spoken with someone at that office and has a follow-up appointment within 3 weeks.  Needed to talk through this at length today as it has been stressful for her for some time now and the work environment where she currently is working.  Looking at this new her opportunity and it does seem to be a positive opportunity for her and we will learn more information on it within the next 2 to 3 weeks.  Very goal-directed today and showing some good confidence and motivation.  Remains  goal-directed and talking about certain facets of her job that are not helpful and what she envisions would be helpful.  Working to feel less uncertainty regarding important decisions and having more confidence in herself.  Continues to enjoy being with family and friends and particularly appreciates having good supportive relationships and a sense of more hopefulness.  Shared how she continues to work on her goals and is sensing better motivation and more progress.   Interventions: Cognitive Behavioral Therapy, Solution-Oriented/Positive Psychology, and Ego-Supportive 1.  Identify life conflicts and/or situations including from the past and the present, that support her current symptomology of anxiety/depression. 2.  Develop behavioral and cognitive strategies to reduce or eliminate excessive anxiety, depression. 3.  Patient will work on developing reality based, positive cognitive messages that can help her improve her mood and outlook while also working on building/strengthening self-confidence.    Diagnosis:   ICD-10-CM   1. Generalized anxiety disorder  F41.1      Plan:    Patient today very motivated and working well in session regarding her own self-care and particularly being able to process a new opportunity that may be open for her in terms of making a change in her work situation.  (Not all details included in this note due to patient privacy needs).  Good energy today, good focus, and good effort as she worked very specifically on her treatment goals, some communication in certain relationships, and self-care.  Patient to  continue working on the issues which she confronted in session today and focusing more on her positives.  Goal review and progress/challenges noted with patient.  Next appointment within 2 to 3 weeks.   Barnie Bunde, LCSW

## 2024-08-16 ENCOUNTER — Ambulatory Visit (INDEPENDENT_AMBULATORY_CARE_PROVIDER_SITE_OTHER): Admitting: Psychiatry

## 2024-08-16 DIAGNOSIS — F411 Generalized anxiety disorder: Secondary | ICD-10-CM

## 2024-08-16 NOTE — Progress Notes (Signed)
"   °      [facility] Counselor/Therapist Progress Note  Patient ID: Joan Cobb, MRN: [id number],    Date: 08/16/2024  Time Spent:  53 minutes  Treatment Type: Individual Therapy  Reported Symptoms:  anxiety, depression decreased, work stressors!, negative self-talk, concerns for aging parents    Mental Status Exam:  Appearance:   Neat     Behavior:  Appropriate, Sharing, and Motivated  Motor:  Normal  Speech/Language:   Clear and Coherent  Affect:  anxious  Mood:  anxious  Thought process:  goal directed  Thought content:    WNL  Sensory/Perceptual disturbances:    WNL  Orientation:  oriented to person, place, time/date, situation, day of week, month of year, year, and stated date of Jan. 6, 2026  Attention:  Good  Concentration:  Good  Memory:  WNL  Fund of knowledge:   Good  Insight:    Good  Judgment:   Good  Impulse Control:  Good   Risk Assessment: Danger to Self:  No Self-injurious Behavior: No Danger to Others: No Duty to Warn:no Physical Aggression / Violence:No  Access to Firearms a concern: No  Gang Involvement:No   Subjective:  Patient in session today working further on her anxiety, personal frustrations, some depression, unhappiness, concerns with her parents who are in their 56s, and work related frustrations and exploration of options.  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  (Not all details included in this note due to patient privacy needs).  Also working on some personal frustrations, and some depression although it is better.  Continues to have concerns about her parents who are in their 103s and patient remains supportive.  Patient remains very goal-directed today and used her time well in talking through very personal situation for her and also impacts her job.  On a positive note, is enjoying good supportive relationships recently especially during the holidays and in  certain part of her life feels more hopeful.  Motivation improved.   Interventions: Cognitive Behavioral Therapy, Solution-Oriented/Positive Psychology, and Ego-Supportive 1.  Identify life conflicts and/or situations including from the past and the present, that support her current symptomology of anxiety/depression. 2.  Develop behavioral and cognitive strategies to reduce or eliminate excessive anxiety, depression. 3.  Patient will work on developing reality based, positive cognitive messages that can help her improve her mood and outlook while also working on building/strengthening self-confidence.   Diagnosis:   ICD-10-CM   1. Generalized anxiety disorder  F41.1      Plan:   Patient today showing good motivation as she works in session on her personal and job-related issues.  More smiling even in the midst of stress and uncertainty regarding some of her issues that led her to seek out therapy.  Focusing well and seems to be coaching herself along in some difficult situations.  (Not all details included in this note due to patient/family privacy needs).  States she is trying to focus more on positives, especially the positives about herself and and her efforts to move forward personally and within her work environment.  Goal review and progress/challenges noted with patient.  Next appointment within 2 to 3 weeks.   Barnie Bunde, LCSW                   "

## 2024-08-31 ENCOUNTER — Ambulatory Visit: Admitting: Psychiatry

## 2024-08-31 DIAGNOSIS — F411 Generalized anxiety disorder: Secondary | ICD-10-CM

## 2024-08-31 NOTE — Progress Notes (Signed)
 "       [facility] Counselor/Therapist Progress Note  Patient ID: Joan Cobb, MRN: [id number],    Date: 08/31/2024  Time Spent: 53 minutes   Treatment Type: Individual Therapy  Reported Symptoms: anxiety, depression decreased, work stress!, negative self-talk, concern for aging parents    Mental Status Exam:  Appearance:   Casual and Neat     Behavior:  Appropriate, Sharing, and Motivated  Motor:  Normal  Speech/Language:   Clear and Coherent  Affect:  Depressed and anxious  Mood:  anxious and depressed  Thought process:  goal directed  Thought content:    WNL  Sensory/Perceptual disturbances:    WNL  Orientation:  oriented to person, place, time/date, situation, day of week, month of year, year, and stated date of Jan. 22, 2026  Attention:  Good  Concentration:  Good  Memory:  WNL  Fund of knowledge:   Good  Insight:    Good  Judgment:   Good  Impulse Control:  Good   Risk Assessment: Danger to Self:  No Self-injurious Behavior: No Danger to Others: No Duty to Warn:no Physical Aggression / Violence:No  Access to Firearms a concern: No  Gang Involvement:No    Subjective: Patient in today stressed but happier in some ways as she continued working on her anxiety, frustrations, apprehension, depression, and concerns for her parents in their 52's. Has been in the midst of decision-making re: job change and she has just gotten another job which she accepted and has notified current boss. Touchy situation and trying to be efficient now to get ready for new job and be able to leave her current job. Some anxiety and working today in session on better managing that anxiety and strengthening her belief in herself including being able to name her positives, name her challenges, and make very positive statements about her upcoming transition.  Working further on some other personal frustrations and some depression however that is improving.  Is concerned still about her  parents who are in their 22s and wanting to make sure she is able to stay supportive of them.  Based on her schedule with her new job, once it starts, patient should still have the time she needs to be more supportive of her parents.  Very goal-oriented and sees herself meeting some of her goals so far as she steps forward and a much more positive direction, appreciating good supportive relationships, and the support of her husband.  Overall depression and anxiety have decreased some.  Review of treatment goals and patient remains motivated and committed to them as noted in Interventions section below.   Interventions: Cognitive Behavioral Therapy, Solution-Oriented/Positive Psychology, and Ego-Supportive 1.  Identify life conflicts and/or situations including from the past and the present, that support her current symptomology of anxiety/depression. 2.  Develop behavioral and cognitive strategies to reduce or eliminate excessive anxiety, depression. 3.  Patient will work on developing reality based, positive cognitive messages that can help her improve her mood and outlook while also working on building/strengthening self-confidence.   Diagnosis:   ICD-10-CM   1. Generalized anxiety disorder  F41.1       Plan: Patient in session today and focusing heavily on her treatment goal areas of anxiety and depression, noting progress in both.  As noted above, she did get accepted for the job she applied for recently which is and will be in the future what seems to be a positive mood for her and she is very happy about  it, although nervous and anxious as she does sometimes doubt herself and we are working on that in sessions as well.  Less self-doubt noted today.  Strategies reviewed with patient and her work on both her self-doubt and managing her anxiety and depression, both of which have decreased some.  Goal review and progress/challenges noted with patient.  Next appt within 2 weeks.   Barnie Bunde, LCSW                   "

## 2024-09-14 ENCOUNTER — Ambulatory Visit: Admitting: Psychiatry

## 2024-09-14 DIAGNOSIS — F411 Generalized anxiety disorder: Secondary | ICD-10-CM

## 2024-09-14 NOTE — Progress Notes (Signed)
 "       [facility] Counselor/Therapist Progress Note  Patient ID: Joan Cobb, MRN: [id number],    Date: 09/14/2024  Time Spent: 60 minutes   Treatment Type: Individual Therapy  Reported Symptoms: anxiety, stressed, depression, negative self-talk and self-esteem issues, concern about aging, very stressed with new job, easy to jump to negative conclusions   Mental Status Exam:  Appearance:   Casual and Neat     Behavior:  Appropriate, Sharing, and Motivated  Motor:  Normal  Speech/Language:   Clear and Coherent  Affect:  Depressed and anxious  Mood:  anxious and depressed  Thought process:  goal directed  Thought content:    Rumination  Sensory/Perceptual disturbances:    WNL  Orientation:  oriented to person, place, time/date, situation, day of week, month of year, year, and stated date of Feb. 5, 2026  Attention:  Good  Concentration:  Good  Memory:  WNL  Fund of knowledge:   Good  Insight:    Good  Judgment:   Good  Impulse Control:  Good   Risk Assessment: Danger to Self:  No Self-injurious Behavior: No Danger to Others: No Duty to Warn:no Physical Aggression / Violence:No  Access to Firearms a concern: No  Gang Involvement:No   Subjective:  Patient today quite upset, frustrated, fearful, negative self-talk, related to her new job she just began this week.  Worked on her self-negating and tendency to see more the negatives than positives.  Did work hard in session and was able to get to a better place emotionally and have a better plan in place to give this new job and the training a little more time versus make an impulsive decision that she might later regret.  Also knows that if the situation does not improve, she can make a different choice and step back, let go, and search for something else as far as employment.  Seemed much more calm at end of session after talking through a lot of the details, her feelings and emotions, and working with her tendency to  defeat herself at times and instead work on becoming more of her own best friend, seeing her positives, and also knowing that she can give this new job really good try and if it does not feel like a good fit for her, she has a strength to acknowledge that and move on to consider a different job.  Husband very supportive.  Also has some good supportive friendships.  Encouraged to take time just for herself outside of work, get good sleep, and practice more positive self-talk versus negative which tends to feed depression, anxiety, and self-defeating thoughts.   Interventions: Cognitive Behavioral Therapy, Solution-Oriented/Positive Psychology, and Ego-Supportive Identify life conflicts and/or situations including from the past and the present, that support her current symptomology of anxiety/depression. Develop behavioral and cognitive strategies to reduce or eliminate excessive anxiety, depression. 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  Diagnosis:   ICD-10-CM   1. Generalized anxiety disorder  F41.1       Plan: Patient today in session feeling stressed, frustrated, fearful and trying not to make impulsive decisions regarding new job recently begun.  Increased negative self-talk and trying to slow down her thinking and decision making, understanding she does not have to make a decision right away and does want to be able to give the situation a little more of a  chance.  (Some details not included in this note due to patient privacy needs.) Worked in session on patient's self-doubt, tendency to self blame, and helping her step back a little from the situation and realize this is her first week in her new job and it is realistic that she would not be on top of everything and that is why she is in training.  Helping her also realize the increase in her negative  self-talk and how that can impact both her performance, self-esteem, and make the learning process more difficult.  Worked with several specific examples in session today which seem very helpful to patient and she was able to regroup and arrived at a better strategy and approaching her new job that we will give her some time to focus on learning more details and expectations and also being able to ask her supervisor when there are questions or a lack of understanding in some details on her job.  Did seem to leave session feeling some better about herself and wanting to stay in the process and not give up quickly on her new job.  Seems more accepting of the fact that if she felt, after trying for a while, that the new job was not a good fit for her, then she would at least know I tried and that it is okay to sometimes say no and walk away from the situation if it is not healthy for her.  Some improvement in her self-esteem, and valuing herself and her struggles, and in her overall outlook.  To return again within 1 week.  Goal review and progress/challenges noted with patient.  Next appointment within 2 weeks.   Barnie Bunde, LCSW                   "

## 2024-09-15 ENCOUNTER — Ambulatory Visit: Admitting: Psychiatry

## 2024-09-20 ENCOUNTER — Ambulatory Visit (INDEPENDENT_AMBULATORY_CARE_PROVIDER_SITE_OTHER): Admitting: Psychiatry

## 2024-09-20 ENCOUNTER — Ambulatory Visit: Admitting: Psychiatry

## 2024-09-27 ENCOUNTER — Ambulatory Visit: Admitting: Psychiatry

## 2024-10-04 ENCOUNTER — Ambulatory Visit: Admitting: Psychiatry

## 2024-10-25 ENCOUNTER — Ambulatory Visit: Admitting: Psychiatry
